# Patient Record
Sex: Female | Born: 1980 | Race: Black or African American | Hispanic: No | Marital: Married | Smoking: Never smoker
Health system: Southern US, Community
[De-identification: ages and names within clinical notes are randomized; demographics above are authoritative.]

## PROBLEM LIST (undated history)

## (undated) DIAGNOSIS — Z803 Family history of malignant neoplasm of breast: Secondary | ICD-10-CM

## (undated) DIAGNOSIS — Z800 Family history of malignant neoplasm of digestive organs: Secondary | ICD-10-CM

## (undated) DIAGNOSIS — Z8049 Family history of malignant neoplasm of other genital organs: Secondary | ICD-10-CM

## (undated) HISTORY — DX: Family history of malignant neoplasm of breast: Z80.3

## (undated) HISTORY — DX: Family history of malignant neoplasm of other genital organs: Z80.49

## (undated) HISTORY — DX: Family history of malignant neoplasm of digestive organs: Z80.0

---

## 2001-10-25 ENCOUNTER — Other Ambulatory Visit: Admission: RE | Admit: 2001-10-25 | Discharge: 2001-10-25 | Payer: Self-pay | Admitting: Internal Medicine

## 2014-03-13 DIAGNOSIS — O009 Unspecified ectopic pregnancy without intrauterine pregnancy: Secondary | ICD-10-CM | POA: Diagnosis present

## 2014-03-13 HISTORY — DX: Unspecified ectopic pregnancy without intrauterine pregnancy: O00.90

## 2014-07-10 DIAGNOSIS — N911 Secondary amenorrhea: Secondary | ICD-10-CM | POA: Insufficient documentation

## 2019-01-28 ENCOUNTER — Inpatient Hospital Stay (HOSPITAL_COMMUNITY): Payer: Federal, State, Local not specified - PPO | Admitting: Anesthesiology

## 2019-01-28 ENCOUNTER — Encounter (HOSPITAL_COMMUNITY)
Admission: AD | Disposition: A | Discharge status: Home / Self Care | Payer: Self-pay | Source: Home / Self Care | Attending: Obstetrics and Gynecology

## 2019-01-28 ENCOUNTER — Other Ambulatory Visit: Payer: Self-pay

## 2019-01-28 ENCOUNTER — Encounter (HOSPITAL_COMMUNITY): Payer: Self-pay | Admitting: *Deleted

## 2019-01-28 ENCOUNTER — Ambulatory Visit (HOSPITAL_COMMUNITY)
Admission: AD | Admit: 2019-01-28 | Discharge: 2019-01-28 | Disposition: A | Discharge status: Home / Self Care | Payer: Federal, State, Local not specified - PPO | Attending: Obstetrics and Gynecology | Admitting: Obstetrics and Gynecology

## 2019-01-28 ENCOUNTER — Ambulatory Visit: Admit: 2019-01-28 | Payer: Federal, State, Local not specified - PPO | Admitting: Obstetrics and Gynecology

## 2019-01-28 DIAGNOSIS — Z3A09 9 weeks gestation of pregnancy: Secondary | ICD-10-CM | POA: Diagnosis not present

## 2019-01-28 DIAGNOSIS — Z20828 Contact with and (suspected) exposure to other viral communicable diseases: Secondary | ICD-10-CM | POA: Diagnosis not present

## 2019-01-28 DIAGNOSIS — O09521 Supervision of elderly multigravida, first trimester: Secondary | ICD-10-CM | POA: Diagnosis not present

## 2019-01-28 DIAGNOSIS — Z3480 Encounter for supervision of other normal pregnancy, unspecified trimester: Secondary | ICD-10-CM | POA: Diagnosis not present

## 2019-01-28 DIAGNOSIS — Z6791 Unspecified blood type, Rh negative: Secondary | ICD-10-CM | POA: Diagnosis not present

## 2019-01-28 DIAGNOSIS — O009 Unspecified ectopic pregnancy without intrauterine pregnancy: Secondary | ICD-10-CM | POA: Diagnosis present

## 2019-01-28 DIAGNOSIS — O26899 Other specified pregnancy related conditions, unspecified trimester: Secondary | ICD-10-CM | POA: Diagnosis not present

## 2019-01-28 DIAGNOSIS — O00101 Right tubal pregnancy without intrauterine pregnancy: Secondary | ICD-10-CM | POA: Insufficient documentation

## 2019-01-28 DIAGNOSIS — Z3201 Encounter for pregnancy test, result positive: Secondary | ICD-10-CM | POA: Diagnosis not present

## 2019-01-28 DIAGNOSIS — O09291 Supervision of pregnancy with other poor reproductive or obstetric history, first trimester: Secondary | ICD-10-CM | POA: Diagnosis not present

## 2019-01-28 HISTORY — DX: Unspecified ectopic pregnancy without intrauterine pregnancy: O00.90

## 2019-01-28 HISTORY — PX: DIAGNOSTIC LAPAROSCOPY WITH REMOVAL OF ECTOPIC PREGNANCY: SHX6449

## 2019-01-28 LAB — COMPREHENSIVE METABOLIC PANEL
ALT: 22 U/L (ref 0–44)
AST: 16 U/L (ref 15–41)
Albumin: 4.1 g/dL (ref 3.5–5.0)
Alkaline Phosphatase: 59 U/L (ref 38–126)
Anion gap: 10 (ref 5–15)
BUN: 6 mg/dL (ref 6–20)
CO2: 19 mmol/L — ABNORMAL LOW (ref 22–32)
Calcium: 9.1 mg/dL (ref 8.9–10.3)
Chloride: 107 mmol/L (ref 98–111)
Creatinine, Ser: 0.64 mg/dL (ref 0.44–1.00)
GFR calc Af Amer: >60 mL/min (ref >60)
GFR calc non Af Amer: >60 mL/min (ref >60)
Glucose, Bld: 90 mg/dL (ref 70–99)
Potassium: 3.6 mmol/L (ref 3.5–5.1)
Sodium: 136 mmol/L (ref 135–145)
Total Bilirubin: 0.6 mg/dL (ref 0.3–1.2)
Total Protein: 7.2 g/dL (ref 6.5–8.1)

## 2019-01-28 LAB — CBC
HCT: 37.8 % (ref 36.0–46.0)
Hemoglobin: 12.5 g/dL (ref 12.0–15.0)
MCH: 29.3 pg (ref 26.0–34.0)
MCHC: 33.1 g/dL (ref 30.0–36.0)
MCV: 88.7 fL (ref 80.0–100.0)
Platelets: 318 10*3/uL (ref 150–400)
RBC: 4.26 MIL/uL (ref 3.87–5.11)
RDW: 12.6 % (ref 11.5–15.5)
WBC: 8.2 10*3/uL (ref 4.0–10.5)
nRBC: 0 % (ref 0.0–0.2)

## 2019-01-28 LAB — TYPE AND SCREEN
ABO/RH(D): O NEG
Antibody Screen: NEGATIVE
Weak D: NEGATIVE

## 2019-01-28 LAB — ABO/RH: ABO/RH(D): O NEG

## 2019-01-28 LAB — HIV ANTIBODY (ROUTINE TESTING W REFLEX): HIV 1&2 Ab, 4th Generation: NONREACTIVE

## 2019-01-28 LAB — SARS CORONAVIRUS 2 BY RT PCR (HOSPITAL ORDER, PERFORMED IN ~~LOC~~ HOSPITAL LAB): SARS Coronavirus 2: NEGATIVE

## 2019-01-28 LAB — CBC AND DIFFERENTIAL
HCT: 37 (ref 36–46)
Hemoglobin: 12.2 (ref 12.0–16.0)
Platelets: 325 (ref 150–399)
WBC: 7.9

## 2019-01-28 LAB — HM HIV SCREENING LAB: HM HIV Screening: NEGATIVE

## 2019-01-28 LAB — HCG, QUANTITATIVE, PREGNANCY: hCG, Beta Chain, Quant, S: 25413 m[IU]/mL — ABNORMAL HIGH (ref <5)

## 2019-01-28 SURGERY — LAPAROSCOPY, WITH ECTOPIC PREGNANCY SURGICAL TREATMENT
Anesthesia: General

## 2019-01-28 SURGERY — LAPAROSCOPY, WITH ECTOPIC PREGNANCY SURGICAL TREATMENT
Anesthesia: General | Laterality: Right

## 2019-01-28 MED ORDER — DEXAMETHASONE SODIUM PHOSPHATE 4 MG/ML IJ SOLN
INTRAMUSCULAR | Status: DC | PRN
  Administered 2019-01-28 (×2): 5 mg via INTRAVENOUS

## 2019-01-28 MED ORDER — ACETAMINOPHEN 10 MG/ML IV SOLN
INTRAVENOUS | Status: AC
  Filled 2019-01-28: qty 100

## 2019-01-28 MED ORDER — CEFAZOLIN SODIUM-DEXTROSE 2-4 GM/100ML-% IV SOLN
2.0000 g | INTRAVENOUS | Status: AC
  Administered 2019-01-28: 2 g via INTRAVENOUS
  Filled 2019-01-28: qty 100

## 2019-01-28 MED ORDER — PHENYLEPHRINE 40 MCG/ML (10ML) SYRINGE FOR IV PUSH (FOR BLOOD PRESSURE SUPPORT)
PREFILLED_SYRINGE | INTRAVENOUS | Status: AC
  Filled 2019-01-28: qty 40

## 2019-01-28 MED ORDER — PROPOFOL 10 MG/ML IV BOLUS
INTRAVENOUS | Status: DC | PRN
  Administered 2019-01-28: 170 mg via INTRAVENOUS

## 2019-01-28 MED ORDER — ACETAMINOPHEN 10 MG/ML IV SOLN
1000.0000 mg | Freq: Four times a day (QID) | INTRAVENOUS | Status: DC
  Administered 2019-01-28: 1000 mg via INTRAVENOUS

## 2019-01-28 MED ORDER — RHO D IMMUNE GLOBULIN 1500 UNIT/2ML IJ SOSY
300.0000 ug | PREFILLED_SYRINGE | Freq: Once | INTRAMUSCULAR | Status: AC
  Administered 2019-01-28: 16:00:00 300 ug via INTRAMUSCULAR
  Filled 2019-01-28: qty 2

## 2019-01-28 MED ORDER — ALBUMIN HUMAN 5 % IV SOLN
INTRAVENOUS | Status: DC | PRN
  Administered 2019-01-28: 18:00:00 via INTRAVENOUS

## 2019-01-28 MED ORDER — SUCCINYLCHOLINE CHLORIDE 20 MG/ML IJ SOLN
INTRAMUSCULAR | Status: DC | PRN
  Administered 2019-01-28: 120 mg via INTRAVENOUS

## 2019-01-28 MED ORDER — MIDAZOLAM HCL 5 MG/5ML IJ SOLN
INTRAMUSCULAR | Status: DC | PRN
  Administered 2019-01-28: 2 mg via INTRAVENOUS

## 2019-01-28 MED ORDER — IBUPROFEN 800 MG PO TABS: 800.0000 mg | ORAL_TABLET | Freq: Three times a day (TID) | ORAL | 0 refills | Status: DC | PRN

## 2019-01-28 MED ORDER — BUPIVACAINE HCL (PF) 0.25 % IJ SOLN
INTRAMUSCULAR | Status: AC
  Filled 2019-01-28: qty 30

## 2019-01-28 MED ORDER — ACETAMINOPHEN 500 MG PO TABS: 500.0000 mg | ORAL_TABLET | Freq: Three times a day (TID) | ORAL | 0 refills | Status: DC | PRN

## 2019-01-28 MED ORDER — SUCCINYLCHOLINE CHLORIDE 200 MG/10ML IV SOSY
PREFILLED_SYRINGE | INTRAVENOUS | Status: AC
  Filled 2019-01-28: qty 10

## 2019-01-28 MED ORDER — MIDAZOLAM HCL 2 MG/2ML IJ SOLN
INTRAMUSCULAR | Status: AC
  Filled 2019-01-28: qty 2

## 2019-01-28 MED ORDER — BUPIVACAINE HCL (PF) 0.25 % IJ SOLN
INTRAMUSCULAR | Status: DC | PRN
  Administered 2019-01-28: 30 mL

## 2019-01-28 MED ORDER — DEXAMETHASONE SODIUM PHOSPHATE 10 MG/ML IJ SOLN
INTRAMUSCULAR | Status: AC
  Filled 2019-01-28: qty 1

## 2019-01-28 MED ORDER — PHENYLEPHRINE HCL (PRESSORS) 10 MG/ML IV SOLN
INTRAVENOUS | Status: DC | PRN
  Administered 2019-01-28: 120 ug via INTRAVENOUS
  Administered 2019-01-28: 80 ug via INTRAVENOUS
  Administered 2019-01-28 (×2): 160 ug via INTRAVENOUS
  Administered 2019-01-28: 80 ug via INTRAVENOUS

## 2019-01-28 MED ORDER — SUGAMMADEX SODIUM 200 MG/2ML IV SOLN
INTRAVENOUS | Status: DC | PRN
  Administered 2019-01-28: 400 mg via INTRAVENOUS

## 2019-01-28 MED ORDER — LIDOCAINE HCL (CARDIAC) PF 100 MG/5ML IV SOSY
PREFILLED_SYRINGE | INTRAVENOUS | Status: DC | PRN
  Administered 2019-01-28: 80 mg via INTRAVENOUS

## 2019-01-28 MED ORDER — OXYCODONE HCL 5 MG PO TABS: 5.0000 mg | ORAL_TABLET | Freq: Four times a day (QID) | ORAL | 0 refills | Status: DC | PRN

## 2019-01-28 MED ORDER — FENTANYL CITRATE (PF) 100 MCG/2ML IJ SOLN
INTRAMUSCULAR | Status: DC | PRN
  Administered 2019-01-28: 100 ug via INTRAVENOUS

## 2019-01-28 MED ORDER — FENTANYL CITRATE (PF) 250 MCG/5ML IJ SOLN
INTRAMUSCULAR | Status: AC
  Filled 2019-01-28: qty 5

## 2019-01-28 MED ORDER — LACTATED RINGERS IV SOLN
INTRAVENOUS | Status: DC
  Administered 2019-01-28: 18:00:00 via INTRAVENOUS

## 2019-01-28 MED ORDER — PHENYLEPHRINE 40 MCG/ML (10ML) SYRINGE FOR IV PUSH (FOR BLOOD PRESSURE SUPPORT)
PREFILLED_SYRINGE | INTRAVENOUS | Status: AC
  Filled 2019-01-28: qty 10

## 2019-01-28 MED ORDER — ONDANSETRON HCL 4 MG/2ML IJ SOLN
INTRAMUSCULAR | Status: AC
  Filled 2019-01-28: qty 2

## 2019-01-28 MED ORDER — ROCURONIUM BROMIDE 10 MG/ML (PF) SYRINGE
PREFILLED_SYRINGE | INTRAVENOUS | Status: AC
  Filled 2019-01-28: qty 10

## 2019-01-28 MED ORDER — ROCURONIUM BROMIDE 100 MG/10ML IV SOLN
INTRAVENOUS | Status: DC | PRN
  Administered 2019-01-28: 40 mg via INTRAVENOUS

## 2019-01-28 MED ORDER — HYDROMORPHONE HCL 1 MG/ML IJ SOLN: 0.2500 mg | INTRAMUSCULAR | Status: DC | PRN

## 2019-01-28 MED ORDER — ONDANSETRON HCL 4 MG/2ML IJ SOLN
INTRAMUSCULAR | Status: DC | PRN
  Administered 2019-01-28: 4 mg via INTRAVENOUS

## 2019-01-28 MED ORDER — 0.9 % SODIUM CHLORIDE (POUR BTL) OPTIME
TOPICAL | Status: DC | PRN
  Administered 2019-01-28: 1000 mL

## 2019-01-28 SURGICAL SUPPLY — 35 items
ADH SKN CLS LQ APL DERMABOND (GAUZE/BANDAGES/DRESSINGS) ×1
BAG SPEC RTRVL LRG 6X4 10 (ENDOMECHANICALS)
CABLE HIGH FREQUENCY MONO STRZ (ELECTRODE) IMPLANT
DERMABOND ADHESIVE PROPEN (GAUZE/BANDAGES/DRESSINGS) ×2
DERMABOND ADVANCED .7 DNX6 (GAUZE/BANDAGES/DRESSINGS) IMPLANT
DRESSING OPSITE X SMALL 2X3 (GAUZE/BANDAGES/DRESSINGS) ×2 IMPLANT
DRSG OPSITE POSTOP 3X4 (GAUZE/BANDAGES/DRESSINGS) IMPLANT
GLOVE BIOGEL M 6.5 STRL (GLOVE) ×6 IMPLANT
GLOVE BIOGEL PI IND STRL 6.5 (GLOVE) ×1 IMPLANT
GLOVE BIOGEL PI IND STRL 7.0 (GLOVE) ×2 IMPLANT
GLOVE BIOGEL PI INDICATOR 6.5 (GLOVE) ×6
GLOVE BIOGEL PI INDICATOR 7.0 (GLOVE) ×4
GOWN STRL REUS W/ TWL LRG LVL3 (GOWN DISPOSABLE) ×2 IMPLANT
GOWN STRL REUS W/TWL LRG LVL3 (GOWN DISPOSABLE) ×6
KIT TURNOVER KIT B (KITS) ×3 IMPLANT
NS IRRIG 1000ML POUR BTL (IV SOLUTION) ×3 IMPLANT
PACK LAPAROSCOPY BASIN (CUSTOM PROCEDURE TRAY) ×3 IMPLANT
PACK TRENDGUARD 450 HYBRID PRO (MISCELLANEOUS) IMPLANT
POUCH SPECIMEN RETRIEVAL 10MM (ENDOMECHANICALS) IMPLANT
PROTECTOR NERVE ULNAR (MISCELLANEOUS) ×6 IMPLANT
SEALER TISSUE G2 CVD JAW 35 (ENDOMECHANICALS) IMPLANT
SEALER TISSUE G2 CVD JAW 45CM (ENDOMECHANICALS)
SET IRRIG TUBING LAPAROSCOPIC (IRRIGATION / IRRIGATOR) ×2 IMPLANT
SET TUBE SMOKE EVAC HIGH FLOW (TUBING) ×3 IMPLANT
SHEARS HARMONIC ACE PLUS 36CM (ENDOMECHANICALS) ×2 IMPLANT
SLEEVE ENDOPATH XCEL 5M (ENDOMECHANICALS) ×3 IMPLANT
SOLUTION ELECTROLUBE (MISCELLANEOUS) ×2 IMPLANT
SUT VICRYL 0 UR6 27IN ABS (SUTURE) IMPLANT
SUT VICRYL 4-0 PS2 18IN ABS (SUTURE) ×3 IMPLANT
TOWEL GREEN STERILE FF (TOWEL DISPOSABLE) ×6 IMPLANT
TRAY FOLEY W/BAG SLVR 14FR (SET/KITS/TRAYS/PACK) ×3 IMPLANT
TRENDGUARD 450 HYBRID PRO PACK (MISCELLANEOUS) ×3
TROCAR XCEL NON-BLD 11X100MML (ENDOMECHANICALS) ×2 IMPLANT
TROCAR XCEL NON-BLD 5MMX100MML (ENDOMECHANICALS) ×3 IMPLANT
WARMER LAPAROSCOPE (MISCELLANEOUS) ×3 IMPLANT

## 2019-01-28 NOTE — MAU Note (Signed)
Covid swab collected. PT tolerated well. PT asymptomatic 

## 2019-01-28 NOTE — Op Note (Signed)
01/28/2019  7:10 PM  PATIENT:  Carly White  38 y.o. female  PRE-OPERATIVE DIAGNOSIS:  Ectopic pregnancy  POST-OPERATIVE DIAGNOSIS:  Ectopic pregnancy  PROCEDURE:  Procedure(s): DIAGNOSTIC LAPAROSCOPY  for removal of ectopic pregnancy (Right)  SURGEON:  Surgeon(s) and Role:    Christophe Louis, MD - Primary    * Dian Queen, MD - Assisting  PHYSICIAN ASSISTANT:NOne   ASSISTANTS: Dr. Thurnell Lose assisted due to complexity of the surgery    ANESTHESIA:   general  EBL:  15 mL   BLOOD ADMINISTERED:none  DRAINS: none   LOCAL MEDICATIONS USED:  MARCAINE     SPECIMEN:  Source of Specimen:  Portion of the right fallopian tube and right ectopic pregnancy   DISPOSITION OF SPECIMEN:  PATHOLOGY  COUNTS:  YES  TOURNIQUET:  * No tourniquets in log *  DICTATION: .Dragon Dictation  PLAN OF CARE: Discharge to home after PACU  PATIENT DISPOSITION:  PACU - hemodynamically stable.   Delay start of Pharmacological VTE agent (>24hrs) due to surgical blood loss or risk of bleeding: not applicable   Findings: Dilated right fallopian tube with ecotpic pregnancy at the distal portion. Normal right ovary. Normal left ovary and fallopian tube.   Procedure: Procedure: the patient was taken to the operating room placed under general anesthesia. Time Out was performed.  She was  Prepped and draped in the normal sterile fashion. A foley catheter was placed. A uterine manipulator was placed. Attention was turned to the abdomen where the umbilicus was injected with 10 cc of marcaine. A 10 mm trocar was placed under direct visualization. Pneumoperitoneum was achieved with C02 gas... A 5 mm trocar was placed in the right and left lower quadrants. Each trocar site was injected with 10 cc of marcaine prior to trocar placement. The harmonic scalpel was used to excise  The portion of the  right  fallopian tube was excised along the mesosalpinx to the cornu with the harmonic scalpel.  An endo catch  bag was placed through the 10 mm umbilical port. The specimen was placed in the bag and removed through the umbilical incision. Pneumoperitoneum was reestablished.  The pelvis was irrigated. . Excellent hemostasis was noted. All trocars were removed under direct visualization . The pneumoperitoneum was released.  The fascia of the umbilical incision was re approximated with 0 vicryl. The skin incisions were closed with 4-0 vicryl and derma bond.  the patient was taken to the recovery room awake and in stable condition.  Sponge lap and needle counts were correct times 2.

## 2019-01-28 NOTE — Anesthesia Postprocedure Evaluation (Signed)
Anesthesia Post Note  Patient: Carly White  Procedure(s) Performed: DIAGNOSTIC LAPAROSCOPY  for removal of ectopic pregnancy (Right )     Patient location during evaluation: PACU Anesthesia Type: General Level of consciousness: awake and alert Pain management: pain level controlled Vital Signs Assessment: post-procedure vital signs reviewed and stable Respiratory status: spontaneous breathing, nonlabored ventilation, respiratory function stable and patient connected to nasal cannula oxygen Cardiovascular status: blood pressure returned to baseline and stable Postop Assessment: no apparent nausea or vomiting Anesthetic complications: no    Last Vitals:  Vitals:   01/28/19 1910 01/28/19 1925  BP: 118/70 111/71  Pulse: (!) 107 98  Resp: 18 15  Temp:  36.9 C  SpO2: 100% 100%    Last Pain:  Vitals:   01/28/19 1855  PainSc: 0-No pain                 Margarita Bobrowski COKER

## 2019-01-28 NOTE — Anesthesia Procedure Notes (Signed)
Procedure Name: Intubation Date/Time: 01/28/2019 5:34 PM Performed by: Oletta Lamas, CRNA Pre-anesthesia Checklist: Patient identified, Emergency Drugs available, Suction available and Patient being monitored Patient Re-evaluated:Patient Re-evaluated prior to induction Oxygen Delivery Method: Circle System Utilized Preoxygenation: Pre-oxygenation with 100% oxygen Induction Type: IV induction and Rapid sequence Laryngoscope Size: Miller and 2 Grade View: Grade I Tube type: Oral Number of attempts: 1 Airway Equipment and Method: Stylet and Oral airway Placement Confirmation: ETT inserted through vocal cords under direct vision,  positive ETCO2 and breath sounds checked- equal and bilateral Secured at: 22 cm Tube secured with: Tape Dental Injury: Teeth and Oropharynx as per pre-operative assessment

## 2019-01-28 NOTE — H&P (Signed)
Subjective: Chief Complaint(s):   etopic pregnancy   HPI:  Isolation Precautions 1. Is fever present / reported?: No, 2. Are respiratory illness symptom(s) present / reported?: No, 3. Are other symptom(s) present / reported?: No, 5. Has there been reported travel to a High Risk respiratory illness region?: No, 6. Has close* contact with person(s) known to have communicable illness been reported?: No, 7. Did travel or close contact (if applicable) occur within 14 days of symptom onset?: No" label="Respiratory Illness Screening" propId="[id number]" catId="[id number]" encId="[id number]"Respiratory Illness Screening 1. Is fever present / reported? No, 2. Are respiratory illness symptom(s) present / reported? No, 3. Are other symptom(s) present / reported? No, 5. Has there been reported travel to a High Risk respiratory illness region? No, 6. Has close* contact with person(s) known to have communicable illness been reported? No, 7. Did travel or close contact (if applicable) occur within 14 days of symptom onset? No.  General 38 y/o presents for ectopic pregnancy She is a G5p1,0,3,1 at 9wks 2d based off of her LMP on 11/24/2018. She had u/s today due to h/o miscarriage. On u/s no gestational sac seen in utero. She has multiple small fibroids, the largest was 1.3cm. In the right adnexa there is a hypoechoic mass measuring 2.8cm, blood flow noted in the mass. This is consistent with ectopic pregnancy.  Of note she has a h/o of ectopic pregnancy. She is unsure what side the ectopic pregnancy was on. She was 38 years old at that time. Pt. states that she has been experiencing some lower back pain on the right side.  Pt. denies experiencing any vaginal bleeding.  Upon pelvic examination no pain noted upon palpation. Current Medication: Taking Prenatal Vitamin 27-0.8 MG Tablet 1 tablet Orally Once a day. Medication List reviewed and reconciled with the patient. Medical History:  Breast Feeding: yes        Allergies/Intolerance:  N.K.D.A.   Gyn History:  Sexual activity currently sexually active. Periods : every month. LMP 11/24/2018. Denies Birth control. Last pap smear date 05/2017-per pt. Denies Last mammogram date. Denies Abnormal pap smear. Denies STD. Menarche 88.   OB History:  Number of pregnancies 5. miscarriages 2. Pregnancy # 1 Ectopic pregnancy. Pregnancy # 2 miscarriage. Pregnancy # 3 miscarriage. Pregnancy # 4: live birth, vaginal delivery, girl. Pregnancy # 5: Current.   Surgical History:  ectopic pregnancy   Hospitalization:  Childbirth 03/2017   Family History:  Father: alive, diagnosed with Hypertension    Mother: alive, Breast cancer    Maternal Grand Mother: Ovarian cancer    1 sister(s) . 1daughter(s) .    Mother with breast cancer (2002) MGM with Ovarian Cancer.  Social History: General no Alcohol, Social-stopped once pregnant.  Children: 1 girl.  Tobacco use cigarettes: Never smoked, Tobacco history last updated 01/28/2019.  Marital Status: married.  no Recreational drug use.  OCCUPATION: employed, Engineer, structural.   ROS: CONSTITUTIONAL No" label="Chills" value="" options="no,yes" propid="91" itemid="193425" categoryid="10464" encounterid="[id number]"Chills No. No" label="Fatigue" value="" options="no,yes" propid="91" itemid="[id number]" categoryid="10464" encounterid="[id number]"Fatigue No. No" label="Fever" value="" options="no,yes" propid="91" itemid="10467" categoryid="10464" encounterid="[id number]"Fever No. No" label="Night sweats" value="" options="no,yes" propid="91" itemid="193426" categoryid="10464" encounterid="[id number]"Night sweats No. No" label="Recent travel outside [place]" value="" options="no,yes" propid="91" itemid="444261" categoryid="10464" encounterid="[id number]"Recent travel outside [place] No. No" label="Sweats" value="" options="no,yes" propid="91" itemid="193427" categoryid="10464" encounterid="[id number]"Sweats No. No" label="Weight change" value="" options="no,yes"  propid="91" itemid="194825" categoryid="10464" encounterid="[id number]"Weight change No.  OPHTHALMOLOGY no" label="Blurring of vision" value="" options="no,yes" propid="91" itemid="12520" categoryid="12516" encounterid="[id number]"Blurring of vision no. no" label="Change in vision" value="" options="no,yes" propid="91" itemid="193469" categoryid="12516" encounterid="[id number]"Change in vision no. no" label="Double  vision" value="" options="no,yes" propid="91" itemid="194379" categoryid="12516" encounterid="[id number]"Double vision no.  ENT no" label="Dizziness" value="" options="no,yes" propid="91" itemid="193612" categoryid="10481" encounterid="[id number]"Dizziness no. Nose bleeds no. Sore throat no. Teeth pain no.  ALLERGY no" label="Hives" value="" options="no,yes" propid="91" itemid="202589" categoryid="138152" encounterid="[id number]"Hives no.  CARDIOLOGY no" label="Chest pain" value="" options="no,yes" propid="91" itemid="193603" categoryid="10488" encounterid="[id number]"Chest pain no. no" label="High blood pressure" value="" options="no,yes" propid="91" itemid="199089" categoryid="10488" encounterid="[id number]"High blood pressure no. no" label="Irregular heart beat" value="" options="no,yes" propid="91" itemid="202598" categoryid="10488" encounterid="[id number]"Irregular heart beat no. no" label="Leg edema" value="" options="no,yes" propid="91" itemid="10491" categoryid="10488" encounterid="[id number]"Leg edema no. no" label="Palpitations" value="" options="no,yes" propid="91" itemid="10490" categoryid="10488" encounterid="[id number]"Palpitations no.  RESPIRATORY no" label="Shortness of breath" value="" options="no" propid="91" itemid="270013" categoryid="138132" encounterid="[id number]"Shortness of breath no. no" label="Cough" value="" options="no,yes" propid="91" itemid="172745" categoryid="138132" encounterid="[id number]"Cough no. no" label="Wheezing" value="" options="no,yes" propid="91" itemid="193621"  categoryid="138132" encounterid="[id number]"Wheezing no.  UROLOGY no" label="Pain with urination" value="" options="no,yes" propid="91" itemid="194377" categoryid="138166" encounterid="[id number]"Pain with urination no. no" label="Urinary urgency" value="" options="no,yes" propid="91" itemid="193493" categoryid="138166" encounterid="[id number]"Urinary urgency no. no" label="Urinary frequency" value="" options="no,yes" propid="91" itemid="193492" categoryid="138166" encounterid="[id number]"Urinary frequency no. no" label="Urinary incontinence" value="" options="no,yes" propid="91" itemid="138171" categoryid="138166" encounterid="[id number]"Urinary incontinence no. No" label="Difficulty urinating" value="" options="no,yes" propid="91" itemid="138167" categoryid="138166" encounterid="[id number]"Difficulty urinating No. No" label="Blood in urine" value="" options="no,yes" propid="91" itemid="138168" categoryid="138166" encounterid="[id number]"Blood in urine No.  GASTROENTEROLOGY no" label="Abdominal pain" value="" options="no,yes" propid="91" itemid="10496" categoryid="10494" encounterid="[id number]"Abdominal pain no. no" label="Appetite change" value="" options="no,yes" propid="91" itemid="193447" categoryid="10494" encounterid="[id number]"Appetite change no. no" label="Bloating/belching" value="" options="no,yes" propid="91" itemid="193448" categoryid="10494" encounterid="[id number]"Bloating/belching no. no" label="Blood in stool or on toilet paper" value="" options="no,yes" propid="91" itemid="10503" categoryid="10494" encounterid="[id number]"Blood in stool or on toilet paper no. no" label="Change in bowel movements" value="" options="no,yes" propid="91" itemid="199106" categoryid="10494" encounterid="[id number]"Change in bowel movements no. no" label="Constipation" value="" options="no,yes" propid="91" itemid="10501" categoryid="10494" encounterid="[id number]"Constipation no. no" label="Diarrhea" value="" options="no,yes" propid="91"  itemid="10502" categoryid="10494" encounterid="[id number]"Diarrhea no. no" label="Difficulty swallowing" value="" options="no,yes" propid="91" itemid="199104" categoryid="10494" encounterid="[id number]"Difficulty swallowing no. no" label="Nausea" value="" options="no,yes" propid="91" itemid="10499" categoryid="10494" encounterid="[id number]"Nausea no.  FEMALE REPRODUCTIVE no" label="Vulvar pain" value="" options="no,yes" propid="91" itemid="453725" categoryid="10525" encounterid="[id number]"Vulvar pain no. no" label="Vulvar rash" value="" options="no,yes" propid="91" itemid="453726" categoryid="10525" encounterid="[id number]"Vulvar rash no. no" label="Abnormal vaginal bleeding" value="" options="no, yes" propid="91" itemid="444315" categoryid="10525" encounterid="[id number]"Abnormal vaginal bleeding no. no" label="Breast pain" value="" options="no,yes" propid="91" itemid="186083" categoryid="10525" encounterid="[id number]"Breast pain no. no" label="Nipple discharge" value="" options="no,yes" propid="91" itemid="186084" categoryid="10525" encounterid="[id number]"Nipple discharge no. no" label="Pain with intercourse" value="" options="no,yes" propid="91" itemid="275823" categoryid="10525" encounterid="[id number]"Pain with intercourse no. no" label="Pelvic pain" value="" options="no,yes" propid="91" itemid="186082" categoryid="10525" encounterid="[id number]"Pelvic pain no. no" label="Unusual vaginal discharge" value="" options="no,yes" propid="91" itemid="278230" categoryid="10525" encounterid="[id number]"Unusual vaginal discharge no. no" label="Vaginal itching" value="" options="no,yes" propid="91" itemid="278942" categoryid="10525" encounterid="[id number]"Vaginal itching no.  MUSCULOSKELETAL no" label="Muscle aches" value="" options="no,yes" propid="91" itemid="193461" categoryid="10514" encounterid="[id number]"Muscle aches no.  NEUROLOGY no" label="Headache" value="" options="no,yes" propid="91" itemid="12513" categoryid="12512"  encounterid="[id number]"Headache no. no" label="Tingling/numbness" value="" options="no,yes" propid="91" itemid="12514" categoryid="12512" encounterid="[id number]"Tingling/numbness no. no" label="Weakness" value="" options="no,yes" propid="91" itemid="193468" categoryid="12512" encounterid="[id number]"Weakness no.  PSYCHOLOGY no" label="Depression" value="" options="" propid="91" itemid="275919" categoryid="10520" encounterid="[id number]"Depression no. no" label="Anxiety" value="" options="no,yes" propid="91" itemid="172748" categoryid="10520" encounterid="[id number]"Anxiety no. no" label="Nervousness" value="" options="no,yes" propid="91" itemid="199158" categoryid="10520" encounterid="[id number]"Nervousness no. no" label="Sleep disturbances" value="" options="no,yes" propid="91" itemid="12502" categoryid="10520" encounterid="[id number]"Sleep disturbances no. no " label="Suicidal ideation" value="" options="no,yes" propid="91" itemid="72718" categoryid="10520" encounterid="[id number]"Suicidal ideation no .  ENDOCRINOLOGY no" label="Excessive thirst" value="" options="no,yes" propid="91" itemid="194628" categoryid="12508" encounterid="[id number]"Excessive thirst no. no" label="Excessive urination" value="" options="no,yes" propid="91" itemid="196285" categoryid="12508" encounterid="[id number]"Excessive urination no. no" label="Hair loss" value="" options="no, yes" propid="91" itemid="444314" categoryid="12508" encounterid="[id number]"Hair loss no. no" label="Heat or cold intolerance" value="" options="" propid="91" itemid="447284" categoryid="12508" encounterid="[id number]"Heat or cold intolerance no.  HEMATOLOGY/LYMPH no" label="Abnormal bleeding" value="" options="no,yes" propid="91" itemid="199152" categoryid="138157" encounterid="[id number]"Abnormal bleeding no. no" label="Easy bruising" value="" options="no,yes" propid="91" itemid="170653" categoryid="138157" encounterid="[id number]"Easy bruising no. no" label="Swollen glands"  value="" options="no,yes" propid="91"  itemid="138158" categoryid="138157" encounterid="[id number]"Swollen glands no.  DERMATOLOGY no" label="New/changing skin lesion" value="" options="no,yes" propid="91" itemid="199126" categoryid="12503" encounterid="[id number]"New/changing skin lesion no. no" label="Rash" value="" options="no,yes" propid="91" itemid="12504" categoryid="12503" encounterid="[id number]"Rash no. no" label="Sores" value="" options="" propid="91" itemid="444313" categoryid="12503" encounterid="[id number]"Sores no.   Negative except as stated in HPI.  Objective: Vitals: Wt 178, Ht 63, BMI 31.53, Temp 97.8, Pulse sitting 80, BP sitting 120/70  Past Results: Examination:  General Examination alert, oriented, NAD " label="CONSTITUTIONAL:" categoryPropId="10089" examid="193638"CONSTITUTIONAL: alert, oriented, NAD .  moist, warm" label="SKIN:" categoryPropId="10109" examid="193638"SKIN: moist, warm.  Conjunctiva clear" label="EYES:" categoryPropId="21468" examid="193638"EYES: Conjunctiva clear.  clear to auscultation bilaterally" label="LUNGS:" categoryPropId="87" examid="193638"LUNGS: clear to auscultation bilaterally.  regular rate and rhythm" label="HEART:" categoryPropId="86" examid="193638"HEART: regular rate and rhythm.  soft, non-tender/non-distended, bowel sounds present " label="ABDOMEN:" categoryPropId="88" examid="193638"ABDOMEN: soft, non-tender/non-distended, bowel sounds present .  normal external genitalia, labia - unremarkable, vagina - pink moist mucosa, no lesions or abnormal discharge, cervix - no discharge or lesions or CMT, adnexa - no masses or tenderness, uterus - nontender and normal size on palpation " label="FEMALE GENITOURINARY:" categoryPropId="13414" examid="193638"FEMALE GENITOURINARY: normal external genitalia, labia - unremarkable, vagina - pink moist mucosa, no lesions or abnormal discharge, cervix - no discharge or lesions or CMT, adnexa - no masses or tenderness,  uterus - nontender and normal size on palpation .  no edema present" label="EXTREMITIES:" categoryPropId="89" examid="193638"EXTREMITIES: no edema present.  affect normal, good eye contact" label="PSYCH:" categoryPropId="16316" examid="193638"PSYCH: affect normal, good eye contact.  Physical Examination: Chaperone present for pelvic exam, Chapman,Courtney 01/28/2019 09:49:51 AM &gt; " label="Chaperone present" itemId="278390" categoryId="275238"Chaperone present for pelvic exam, Chapman,Courtney 01/28/2019 09:49:51 AM > .   Pt aware of scribe services today.   Assessment: Assessment:  Ectopic pregnancy - O00.90 (Primary)     Unspecified blood type, Rh negative - Z67.91     Other specified pregnancy related conditions, unspecified trimester - O26.899     Plan: Treatment: Ectopic pregnancy Notes: Discussed with pt. methotrexate vs. laparoscopically for treatment. Discussed risks of laparoscopically with pt. Risks of methotrexate were discussed with pt. Pt. wishes to proceed with Methotrexate injection. [facility]. .. [facility].. quantitative hcg is 25K which is too elevated for methotrexate. recommend operative laparoscopy with removal of ectopic pregnancy possible right salpingectomy. r/b/a of surgery discussed including but not limited to infection/ bleeding / damage to bowel bladder and surrounding organs with the need for further surgery. Pt voiced understanding and desires to proceed. Referral To: Reason: precert for emergency operative laporoscopy with removal of ectopic pregnancy  Unspecified blood type, Rh negative Notes: plan [place].

## 2019-01-28 NOTE — Anesthesia Preprocedure Evaluation (Addendum)
Anesthesia Evaluation  Patient identified by MRN, date of birth, ID band Patient awake    Reviewed: Allergy & Precautions, Patient's Chart, lab work & pertinent test results  Airway Mallampati: II  TM Distance: >3 FB     Dental   Pulmonary neg pulmonary ROS,    breath sounds clear to auscultation       Cardiovascular negative cardio ROS   Rhythm:Regular Rate:Normal     Neuro/Psych negative neurological ROS     GI/Hepatic negative GI ROS, Neg liver ROS,   Endo/Other  negative endocrine ROS  Renal/GU negative Renal ROS     Musculoskeletal negative musculoskeletal ROS (+)   Abdominal   Peds  Hematology negative hematology ROS (+)   Anesthesia Other Findings Ectopic pregnancy  Reproductive/Obstetrics                            Anesthesia Physical Anesthesia Plan  ASA: I and emergent  Anesthesia Plan: General   Post-op Pain Management:    Induction:   PONV Risk Score and Plan: 3 and Ondansetron, Dexamethasone and Midazolam  Airway Management Planned: Oral ETT  Additional Equipment:   Intra-op Plan:   Post-operative Plan:   Informed Consent: I have reviewed the patients History and Physical, chart, labs and discussed the procedure including the risks, benefits and alternatives for the proposed anesthesia with the patient or authorized representative who has indicated his/her understanding and acceptance.     Dental advisory given  Plan Discussed with: CRNA and Anesthesiologist  Anesthesia Plan Comments:        Anesthesia Quick Evaluation

## 2019-01-28 NOTE — Transfer of Care (Signed)
Immediate Anesthesia Transfer of Care Note  Patient: Carly White  Procedure(s) Performed: DIAGNOSTIC LAPAROSCOPY  for removal of ectopic pregnancy (Right )  Patient Location: PACU  Anesthesia Type:General  Level of Consciousness: awake, alert , oriented and patient cooperative  Airway & Oxygen Therapy: Patient Spontanous Breathing  Post-op Assessment: Report given to RN and Post -op Vital signs reviewed and stable  Post vital signs: Reviewed and stable  Last Vitals:  Vitals Value Taken Time  BP 107/91 01/28/19 1855  Temp 36.2 C 01/28/19 1855  Pulse 114 01/28/19 1855  Resp 25 01/28/19 1855  SpO2 100 % 01/28/19 1855    Last Pain:  Vitals:   01/28/19 1855  PainSc: 0-No pain         Complications: No apparent anesthesia complications

## 2019-01-28 NOTE — Discharge Instructions (Signed)
Ectopic Pregnancy  An ectopic pregnancy happens when a fertilized egg grows outside the womb (uterus). The fertilized egg cannot stay alive outside of the womb. This problem often happens in a fallopian tube. It is often caused by damage to the tube. If this problem is found early, you may be treated with medicine that stops the egg from growing. If your tube tears or bursts open (ruptures), you will bleed inside. Often, there is very bad pain in the lower belly. This is an emergency. You will need surgery. Get help right away. Follow these instructions at home: After being treated with medicine or surgery:  Rest and limit your activity for as long as told by your doctor.  Until your doctor says that it is safe: ? Do not lift anything that is heavier than 10 lb (4.5 kg) or the limit that your doctor tells you. ? Avoid exercise and any movement that takes a lot of effort.  To prevent problems when pooping (constipation): ? Eat a healthy diet. This includes:  Fruits.  Vegetables.  Whole grains. ? Drink 6-8 glasses of water a day. Contact a doctor if: Get help right away if:  You have sudden and very bad pain in your belly.  You have very bad pain in your shoulders or neck.  You have pain that gets worse and is not helped by medicine.  You have: ? A fever or chills. ? Vaginal bleeding. ? Redness or swelling at the site of a surgical cut (incision).  You feel sick to your stomach (nauseous) or you throw up (vomit).  You feel dizzy or weak.  You feel light-headed or you pass out (faint). Summary  An ectopic pregnancy happens when a fertilized egg grows outside the womb (uterus).  If this problem is found early, you may be treated with medicine that stops the egg from growing.  If your tube tears or bursts open (ruptures), you will need surgery. This is an emergency. Get help right away. This information is not intended to replace advice given to you by your health care  provider. Make sure you discuss any questions you have with your health care provider. Document Released: 08/26/2008 Document Revised: 05/12/2017 Document Reviewed: 06/23/2016 Elsevier Patient Education  2020 Elsevier Inc.  

## 2019-01-28 NOTE — MAU Note (Signed)
.   Carly White is a 38 y.o. at [redacted]w[redacted]d here in MAU for labs and methotrexate. Dr Landry Mellow called with lab results and states that she will come and talk with pt. Denies pain or VB at this time LMP: 11/24/18 Onset of complaint:  Pain score: 0 Vitals:   01/28/19 1348  BP: (!) 157/79  Pulse: 95  Resp: 16  Temp: 98 F (36.7 C)  SpO2: 100%     Lab orders placed from triage: CBC/CMP/QUANT/

## 2019-01-29 ENCOUNTER — Encounter (HOSPITAL_COMMUNITY): Payer: Self-pay | Admitting: Obstetrics and Gynecology

## 2019-01-29 LAB — RH IG WORKUP (INCLUDES ABO/RH)
ABO/RH(D): O NEG
Antibody Screen: NEGATIVE
Gestational Age(Wks): 9
Unit division: 0

## 2019-01-31 NOTE — Anesthesia Postprocedure Evaluation (Signed)
Anesthesia Post Note  Patient: Kahlie H Vanecek  Procedure(s) Performed: DIAGNOSTIC LAPAROSCOPY  for removal of ectopic pregnancy (Right )     Patient location during evaluation: PACU Anesthesia Type: General Level of consciousness: awake and alert Pain management: pain level controlled Vital Signs Assessment: post-procedure vital signs reviewed and stable Respiratory status: spontaneous breathing, nonlabored ventilation, respiratory function stable and patient connected to nasal cannula oxygen Cardiovascular status: blood pressure returned to baseline and stable Postop Assessment: no apparent nausea or vomiting Anesthetic complications: no    Last Vitals:  Vitals:   01/28/19 1910 01/28/19 1925  BP: 118/70 111/71  Pulse: (!) 107 98  Resp: 18 15  Temp:  36.9 C  SpO2: 100% 100%    Last Pain:  Vitals:   01/28/19 1855  PainSc: 0-No pain                 Jamale Spangler COKER     

## 2019-02-06 NOTE — Addendum Note (Signed)
Addendum  created 02/06/19 1701 by Belinda Block, MD   Attestation recorded in [place], [place] filed

## 2019-02-13 DIAGNOSIS — O009 Unspecified ectopic pregnancy without intrauterine pregnancy: Secondary | ICD-10-CM | POA: Diagnosis not present

## 2019-02-25 DIAGNOSIS — Z3202 Encounter for pregnancy test, result negative: Secondary | ICD-10-CM | POA: Diagnosis not present

## 2019-04-30 ENCOUNTER — Other Ambulatory Visit (HOSPITAL_COMMUNITY)
Admission: RE | Admit: 2019-04-30 | Discharge: 2019-04-30 | Disposition: A | Discharge status: Home / Self Care | Payer: Federal, State, Local not specified - PPO | Source: Ambulatory Visit | Attending: Obstetrics and Gynecology | Admitting: Obstetrics and Gynecology

## 2019-04-30 ENCOUNTER — Other Ambulatory Visit: Payer: Self-pay | Admitting: Obstetrics and Gynecology

## 2019-04-30 DIAGNOSIS — Z01419 Encounter for gynecological examination (general) (routine) without abnormal findings: Secondary | ICD-10-CM | POA: Insufficient documentation

## 2019-05-03 LAB — CYTOLOGY - PAP
Comment: NEGATIVE
Diagnosis: NEGATIVE
High risk HPV: NEGATIVE

## 2019-08-29 DIAGNOSIS — Z23 Encounter for immunization: Secondary | ICD-10-CM | POA: Diagnosis not present

## 2019-09-12 DIAGNOSIS — J301 Allergic rhinitis due to pollen: Secondary | ICD-10-CM | POA: Diagnosis not present

## 2019-09-12 DIAGNOSIS — H1013 Acute atopic conjunctivitis, bilateral: Secondary | ICD-10-CM | POA: Diagnosis not present

## 2019-09-12 DIAGNOSIS — T50995A Adverse effect of other drugs, medicaments and biological substances, initial encounter: Secondary | ICD-10-CM | POA: Diagnosis not present

## 2019-09-27 DIAGNOSIS — Z23 Encounter for immunization: Secondary | ICD-10-CM | POA: Diagnosis not present

## 2020-05-01 DIAGNOSIS — Z01419 Encounter for gynecological examination (general) (routine) without abnormal findings: Secondary | ICD-10-CM | POA: Diagnosis not present

## 2020-06-02 ENCOUNTER — Inpatient Hospital Stay: Payer: Federal, State, Local not specified - PPO | Attending: Oncology | Admitting: Licensed Clinical Social Worker

## 2020-06-02 ENCOUNTER — Other Ambulatory Visit: Payer: Self-pay

## 2020-06-02 ENCOUNTER — Inpatient Hospital Stay: Payer: Federal, State, Local not specified - PPO

## 2020-06-02 ENCOUNTER — Encounter: Payer: Self-pay | Admitting: Licensed Clinical Social Worker

## 2020-06-02 DIAGNOSIS — Z800 Family history of malignant neoplasm of digestive organs: Secondary | ICD-10-CM

## 2020-06-02 DIAGNOSIS — Z803 Family history of malignant neoplasm of breast: Secondary | ICD-10-CM | POA: Insufficient documentation

## 2020-06-02 DIAGNOSIS — Z8049 Family history of malignant neoplasm of other genital organs: Secondary | ICD-10-CM | POA: Diagnosis not present

## 2020-06-02 NOTE — Progress Notes (Addendum)
REFERRING PROVIDER: Christophe Louis, MD [place] [address] [place],  [place] [address]  PRIMARY PROVIDER:  Patient, [name]  PRIMARY REASON FOR VISIT:  1. Family history of breast cancer   2. Family history of colon cancer   3. Family history of cervical cancer      HISTORY OF PRESENT ILLNESS:   Carly White, a 39 y.o. female, was seen for a [facility] cancer genetics consultation at the request of Dr. Landry Mellow due to a family history of cancer.  Carly White presents to clinic today to discuss the possibility of a hereditary predisposition to cancer, genetic testing, and to further clarify her future cancer risks, as well as potential cancer risks for family members.    Carly White is a 39 y.o. female with no personal history of cancer.    CANCER HISTORY:  Oncology History   No history exists.     RISK FACTORS:  Menarche was at age 38.  First live birth at age 72.  OCP use for approximately 0 years.  Ovaries intact: yes.  Hysterectomy: no.  Menopausal status: premenopausal.  HRT use: 0 years. Colonoscopy: no; not examined. Mammogram within the last year: no. Number of breast biopsies: 0. Up to date with pelvic exams: yes. Any excessive radiation exposure in the past: no  Past Medical History:  Diagnosis Date  . Family history of breast cancer   . Family history of cervical cancer   . Family history of colon cancer     Past Surgical History:  Procedure Laterality Date  . DIAGNOSTIC LAPAROSCOPY WITH REMOVAL OF ECTOPIC PREGNANCY Right 01/28/2019   Procedure: DIAGNOSTIC LAPAROSCOPY  for removal of ectopic pregnancy;  Surgeon: Christophe Louis, MD;  Location: [facility];  Service: Gynecology;  Laterality: Right;    Social History   Socioeconomic History  . Marital status: Married    Spouse name: Not on file  . Number of children: Not on file  . Years of education: Not on file  . Highest education level: Not on file  Occupational History  . Not on file  Tobacco Use  . Smoking  status: Never Smoker  . Smokeless tobacco: Never Used  Substance and Sexual Activity  . Alcohol use: Never  . Drug use: Never  . Sexual activity: Yes  Other Topics Concern  . Not on file  Social History Narrative  . Not on file   Social Determinants of Health   Financial Resource Strain: Not on file  Food Insecurity: Not on file  Transportation Needs: Not on file  Physical Activity: Not on file  Stress: Not on file  Social Connections: Not on file     FAMILY HISTORY:  We obtained a detailed, 4-generation family history.  Significant diagnoses are listed below: Family History  Problem Relation Age of Onset  . Breast cancer Mother 85  . Colon cancer Maternal Uncle 35  . Cervical cancer Maternal Grandmother        dx 40s  . Colon cancer Maternal Grandfather        dx 76s  . Breast cancer Maternal Great-grandmother        dx 35s   Carly White has 1 daughter, 3. She has 1 sister, 32, no history of cancer.   Carly White mother had breast cancer at 11 and is living at 3. She does not want genetic testing. Patient has 2 maternal uncles. One had colon cancer at 31. No known cancers in maternal cousins. Maternal grandmother had  cervical cancer. Maternal grandfather had colon cancer in his 45s, his mother had breast cancer in her 47s.   Carly White father is living at 36 with no cancer history. There are no cancers that she is aware of on this side of the family.   Carly White is unaware of previous family history of genetic testing for hereditary cancer risks.  There is no reported Ashkenazi Jewish ancestry. There is no known consanguinity.    GENETIC COUNSELING ASSESSMENT: Carly White is a 39 y.o. female with a family history of breast and colon cancer which is somewhat suggestive of a hereditary cancer syndrome and predisposition to cancer. We, therefore, discussed and recommended the following at today's visit.   DISCUSSION: We discussed that approximately 5-10% of cancer is  hereditary  Most cases of hereditary breast cancer are associated with BRCA1/BRCA2 genes, although there are other genes associated with hereditary breast and colon cancer as well including CHEK2 .  We discussed that testing is beneficial for several reasons including knowing about cancer risks, identifying potential screening and risk-reduction options that may be appropriate, and to understand if other family members could be at risk for cancer and allow them to undergo genetic testing.   We reviewed the characteristics, features and inheritance patterns of hereditary cancer syndromes. We also discussed genetic testing, including the appropriate family members to test, the process of testing, insurance coverage and turn-around-time for results. We discussed the implications of a negative, positive and/or variant of uncertain significant result. We recommended Carly White pursue genetic testing for the [facility] CancerNext-Expanded+RNA gene panel.   The CancerNext-Expanded + RNAinsight gene panel offered by [employer] and includes sequencing and rearrangement analysis for the following 77 genes: IP, ALK, APC*, ATM*, AXIN2, BAP1, BARD1, BLM, BMPR1A, BRCA1*, BRCA2*, BRIP1*, CDC73, CDH1*,CDK4, CDKN1B, CDKN2A, CHEK2*, CTNNA1, DICER1, FANCC, FH, FLCN, GALNT12, KIF1B, LZTR1, MAX, MEN1, MET, MLH1*, MSH2*, MSH3, MSH6*, MUTYH*, NBN, NF1*, NF2, NTHL1, PALB2*, PHOX2B, PMS2*, POT1, PRKAR1A, PTCH1, PTEN*, RAD51C*, RAD51D*,RB1, RECQL, RET, SDHA, SDHAF2, SDHB, SDHC, SDHD, SMAD4, SMARCA4, SMARCB1, SMARCE1, STK11, SUFU, TMEM127, TP53*,TSC1, TSC2, VHL and XRCC2 (sequencing and deletion/duplication); EGFR, EGLN1, HOXB13, KIT, MITF, PDGFRA, POLD1 and POLE (sequencing only); EPCAM and GREM1 (deletion/duplication only). DNA and RNA analyses performed for * genes.  Based on Carly White's family history of cancer, she meets medical criteria for genetic testing. Despite that she meets criteria, she may still have an out of pocket cost.  We discussed that if her out of pocket cost for testing is over $100, the laboratory will call and confirm whether she wants to proceed with testing.  If the out of pocket cost of testing is less than $100 she will be billed by the genetic testing laboratory.   We discussed that some people do not want to undergo genetic testing due to fear of genetic discrimination.  A federal law called the Genetic Information Non-Discrimination Act (GINA) of 2008 helps protect individuals against genetic discrimination based on their genetic test results.  It impacts both health insurance and employment.  For health insurance, it protects against increased premiums, being kicked off insurance or being forced to take a test in order to be insured.  For employment it protects against hiring, firing and promoting decisions based on genetic test results.  Health status due to a cancer diagnosis is not protected under GINA.  This law does not protect life insurance, disability insurance, or other types of insurance.   Based on the patient's family history, a statistical model Building surveyor  [employer]) was used to estimate her risk of developing breast cancer. This estimates her lifetime risk of developing breast cancer to be approximately 24%. This estimation is in the setting of negative genetic test results and may change if she has testing that is positive.  The patient's lifetime breast cancer risk is a preliminary estimate based on available information using one of several models endorsed by the [place] (ACS). The ACS recommends consideration of breast MRI screening as an adjunct to mammography for patients at high risk (defined as 20% or greater lifetime risk).   Carly White has been determined to be at high risk for breast cancer.  Therefore, we recommend that annual screening with mammography and breast MRI be performed. Carly White should discuss her individual situation with her referring physician and determine a  breast cancer screening plan with which they are both comfortable. She could also be seen here by our high risk breast providers.     PLAN:  Despite our recommendation, Carly White did not wish to pursue genetic testing at today's visit. We understand this decision and remain available to coordinate genetic testing at any time in the future. We, therefore, recommend Carly White continue to follow the cancer screening guidelines given by her primary healthcare provider.  Carly White questions were answered to her satisfaction today. Our contact information was provided should additional questions or concerns arise. Thank you for the referral and allowing [place] to share in the care of your patient.   Faith Rogue, MS, [facility] Genetic Counselor [email or web address] Phone: [phone]  The patient was seen for a total of 30 minutes in face-to-face genetic counseling.  Dr. Grayland Ormond was available for discussion regarding this case.   _______________________________________________________________________ For Office Staff:  Number of people involved in session: 1 Was an Intern/ student involved with case: no

## 2020-06-08 ENCOUNTER — Telehealth: Payer: Self-pay | Admitting: Licensed Clinical Social Worker

## 2020-06-08 NOTE — Telephone Encounter (Signed)
error 

## 2020-07-07 DIAGNOSIS — Z6831 Body mass index (BMI) 31.0-31.9, adult: Secondary | ICD-10-CM | POA: Diagnosis not present

## 2020-07-07 DIAGNOSIS — M5430 Sciatica, unspecified side: Secondary | ICD-10-CM | POA: Diagnosis not present

## 2020-07-07 DIAGNOSIS — Z79899 Other long term (current) drug therapy: Secondary | ICD-10-CM | POA: Diagnosis not present

## 2020-07-16 DIAGNOSIS — X58XXXA Exposure to other specified factors, initial encounter: Secondary | ICD-10-CM | POA: Diagnosis not present

## 2020-07-16 DIAGNOSIS — S39012A Strain of muscle, fascia and tendon of lower back, initial encounter: Secondary | ICD-10-CM | POA: Insufficient documentation

## 2020-07-16 DIAGNOSIS — M5136 Other intervertebral disc degeneration, lumbar region: Secondary | ICD-10-CM | POA: Diagnosis not present

## 2020-07-16 DIAGNOSIS — M5459 Other low back pain: Secondary | ICD-10-CM | POA: Diagnosis not present

## 2020-07-16 DIAGNOSIS — M545 Low back pain, unspecified: Secondary | ICD-10-CM | POA: Diagnosis not present

## 2020-07-23 DIAGNOSIS — M545 Low back pain, unspecified: Secondary | ICD-10-CM | POA: Diagnosis not present

## 2020-07-23 DIAGNOSIS — M6281 Muscle weakness (generalized): Secondary | ICD-10-CM | POA: Diagnosis not present

## 2020-07-29 DIAGNOSIS — M545 Low back pain, unspecified: Secondary | ICD-10-CM | POA: Diagnosis not present

## 2020-07-29 DIAGNOSIS — M6281 Muscle weakness (generalized): Secondary | ICD-10-CM | POA: Diagnosis not present

## 2020-08-13 DIAGNOSIS — M545 Low back pain, unspecified: Secondary | ICD-10-CM | POA: Diagnosis not present

## 2020-08-13 DIAGNOSIS — M6281 Muscle weakness (generalized): Secondary | ICD-10-CM | POA: Diagnosis not present

## 2020-12-15 ENCOUNTER — Other Ambulatory Visit: Payer: Self-pay

## 2020-12-15 ENCOUNTER — Encounter (HOSPITAL_COMMUNITY): Payer: Self-pay | Admitting: Obstetrics and Gynecology

## 2020-12-15 ENCOUNTER — Inpatient Hospital Stay (HOSPITAL_COMMUNITY)
Admission: AD | Admit: 2020-12-15 | Discharge: 2020-12-15 | Disposition: A | Discharge status: Home / Self Care | Payer: Federal, State, Local not specified - PPO | Attending: Obstetrics and Gynecology | Admitting: Obstetrics and Gynecology

## 2020-12-15 ENCOUNTER — Inpatient Hospital Stay (HOSPITAL_COMMUNITY): Payer: Federal, State, Local not specified - PPO

## 2020-12-15 DIAGNOSIS — O4691 Antepartum hemorrhage, unspecified, first trimester: Secondary | ICD-10-CM | POA: Insufficient documentation

## 2020-12-15 DIAGNOSIS — Z3A01 Less than 8 weeks gestation of pregnancy: Secondary | ICD-10-CM | POA: Insufficient documentation

## 2020-12-15 DIAGNOSIS — Z3A00 Weeks of gestation of pregnancy not specified: Secondary | ICD-10-CM

## 2020-12-15 DIAGNOSIS — O360190 Maternal care for anti-D [Rh] antibodies, unspecified trimester, not applicable or unspecified: Secondary | ICD-10-CM

## 2020-12-15 DIAGNOSIS — O4690 Antepartum hemorrhage, unspecified, unspecified trimester: Secondary | ICD-10-CM | POA: Diagnosis not present

## 2020-12-15 DIAGNOSIS — O3481 Maternal care for other abnormalities of pelvic organs, first trimester: Secondary | ICD-10-CM | POA: Diagnosis not present

## 2020-12-15 DIAGNOSIS — Z3201 Encounter for pregnancy test, result positive: Secondary | ICD-10-CM | POA: Diagnosis not present

## 2020-12-15 DIAGNOSIS — O3680X0 Pregnancy with inconclusive fetal viability, not applicable or unspecified: Secondary | ICD-10-CM | POA: Diagnosis not present

## 2020-12-15 DIAGNOSIS — Z6791 Unspecified blood type, Rh negative: Secondary | ICD-10-CM | POA: Diagnosis not present

## 2020-12-15 DIAGNOSIS — O209 Hemorrhage in early pregnancy, unspecified: Secondary | ICD-10-CM | POA: Diagnosis not present

## 2020-12-15 LAB — COMPREHENSIVE METABOLIC PANEL WITH GFR
ALT: 21 U/L (ref 0–44)
AST: 20 U/L (ref 15–41)
Albumin: 3.8 g/dL (ref 3.5–5.0)
Alkaline Phosphatase: 53 U/L (ref 38–126)
Anion gap: 6 (ref 5–15)
BUN: 6 mg/dL (ref 6–20)
CO2: 24 mmol/L (ref 22–32)
Calcium: 9 mg/dL (ref 8.9–10.3)
Chloride: 103 mmol/L (ref 98–111)
Creatinine, Ser: 0.82 mg/dL (ref 0.44–1.00)
GFR, Estimated: >60 mL/min
Glucose, Bld: 96 mg/dL (ref 70–99)
Potassium: 4.4 mmol/L (ref 3.5–5.1)
Sodium: 133 mmol/L — ABNORMAL LOW (ref 135–145)
Total Bilirubin: 0.6 mg/dL (ref 0.3–1.2)
Total Protein: 6.8 g/dL (ref 6.5–8.1)

## 2020-12-15 LAB — CBC WITH DIFFERENTIAL/PLATELET
Abs Immature Granulocytes: 0.02 K/uL (ref 0.00–0.07)
Basophils Absolute: 0 K/uL (ref 0.0–0.1)
Basophils Relative: 0 %
Eosinophils Absolute: 0.1 K/uL (ref 0.0–0.5)
Eosinophils Relative: 1 %
HCT: 37.3 % (ref 36.0–46.0)
Hemoglobin: 12.3 g/dL (ref 12.0–15.0)
Immature Granulocytes: 0 %
Lymphocytes Relative: 34 %
Lymphs Abs: 2.9 K/uL (ref 0.7–4.0)
MCH: 29.1 pg (ref 26.0–34.0)
MCHC: 33 g/dL (ref 30.0–36.0)
MCV: 88.4 fL (ref 80.0–100.0)
Monocytes Absolute: 0.7 K/uL (ref 0.1–1.0)
Monocytes Relative: 8 %
Neutro Abs: 4.9 K/uL (ref 1.7–7.7)
Neutrophils Relative %: 57 %
Platelets: 277 K/uL (ref 150–400)
RBC: 4.22 MIL/uL (ref 3.87–5.11)
RDW: 12.4 % (ref 11.5–15.5)
WBC: 8.5 K/uL (ref 4.0–10.5)
nRBC: 0 % (ref 0.0–0.2)

## 2020-12-15 LAB — URINALYSIS, ROUTINE W REFLEX MICROSCOPIC
Bacteria, UA: NONE SEEN
Bilirubin Urine: NEGATIVE
Glucose, UA: NEGATIVE mg/dL
Ketones, ur: NEGATIVE mg/dL
Leukocytes,Ua: NEGATIVE
Nitrite: NEGATIVE
Protein, ur: NEGATIVE mg/dL
Specific Gravity, Urine: 1.01 (ref 1.005–1.030)
pH: 7 (ref 5.0–8.0)

## 2020-12-15 LAB — WET PREP, GENITAL
Clue Cells Wet Prep HPF POC: NONE SEEN
Sperm: NONE SEEN
Trich, Wet Prep: NONE SEEN
Yeast Wet Prep HPF POC: NONE SEEN

## 2020-12-15 LAB — HCG, QUANTITATIVE, PREGNANCY: hCG, Beta Chain, Quant, S: 188 m[IU]/mL — ABNORMAL HIGH

## 2020-12-15 LAB — POCT PREGNANCY, URINE: Preg Test, Ur: POSITIVE — AB

## 2020-12-15 MED ORDER — RHO D IMMUNE GLOBULIN 1500 UNIT/2ML IJ SOSY
300.0000 ug | PREFILLED_SYRINGE | Freq: Once | INTRAMUSCULAR | Status: AC
  Administered 2020-12-15: 300 ug via INTRAMUSCULAR
  Filled 2020-12-15: qty 2

## 2020-12-15 NOTE — Discharge Instructions (Signed)

## 2020-12-15 NOTE — MAU Provider Note (Signed)
History     CSN: [id number]  Arrival date and time: 12/15/20 1142   Event Date/Time   First Provider Initiated Contact with Patient 12/15/20 1235      Chief Complaint  Patient presents with   Abdominal Pain   Vaginal Bleeding   Carly White is a 40 y.o. [phone] at [redacted]w[redacted]d who presents to MAU for vaginal bleeding which began Thursday. Patient endorses heavy bleeding and severe cramping on this day that got progressively better throughout the weekend and today is just having mild cramping that she rates as 1/10 and spotting when wiping only. Patient asked to clarify heavy bleeding she experienced, and patient reports she only had to wear a panty liner during that time.  Blood Type? O NEGATIVE  Pt denies vaginal discharge/odor/itching. Pt denies N/V, abdominal pain, constipation, diarrhea, or urinary problems. Pt denies fever, chills, fatigue, sweating or changes in appetite. Pt denies SOB or chest pain. Pt denies dizziness, HA, light-headedness, weakness.   OB History     Gravida  6   Para  1   Term  1   Preterm      AB  3   Living  1      SAB  2   IAB      Ectopic  1   Multiple      Live Births  1           Past Medical History:  Diagnosis Date   Family history of breast cancer    Family history of cervical cancer    Family history of colon cancer     Past Surgical History:  Procedure Laterality Date   DIAGNOSTIC LAPAROSCOPY WITH REMOVAL OF ECTOPIC PREGNANCY Right 01/28/2019   Procedure: DIAGNOSTIC LAPAROSCOPY  for removal of ectopic pregnancy;  Surgeon: Gerald Leitz, MD;  Location: [facility] OR;  Service: Gynecology;  Laterality: Right;    Family History  Problem Relation Age of Onset   Breast cancer Mother 36   Colon cancer Maternal Uncle 29   Cervical cancer Maternal Grandmother        dx 78s   Colon cancer Maternal Grandfather        dx 39s   Breast cancer Maternal Great-grandmother        dx 46s    Social History   Tobacco Use    Smoking status: Never   Smokeless tobacco: Never  Substance Use Topics   Alcohol use: Never   Drug use: Never    Allergies: No Known Allergies  Medications Prior to Admission  Medication Sig Dispense Refill Last Dose   acetaminophen (TYLENOL) 500 MG tablet Take 1 tablet (500 mg total) by mouth every 8 (eight) hours as needed for mild pain or moderate pain. 30 tablet 0    ibuprofen (ADVIL) 800 MG tablet Take 1 tablet (800 mg total) by mouth every 8 (eight) hours as needed. 30 tablet 0    oxyCODONE (OXY IR/ROXICODONE) 5 MG immediate release tablet Take 1 tablet (5 mg total) by mouth every 6 (six) hours as needed for severe pain. 15 tablet 0     Review of Systems  Constitutional:  Negative for chills, diaphoresis, fatigue and fever.  Eyes:  Negative for visual disturbance.  Respiratory:  Negative for shortness of breath.   Cardiovascular:  Negative for chest pain.  Gastrointestinal:  Negative for abdominal pain, constipation, diarrhea, nausea and vomiting.  Genitourinary:  Positive for pelvic pain and vaginal bleeding. Negative for dysuria, flank pain, frequency, urgency  and vaginal discharge.  Neurological:  Negative for dizziness, weakness, light-headedness and headaches.   Physical Exam   Blood pressure 135/77, pulse 92, temperature 98.4 F (36.9 C), temperature source Oral, resp. rate 16, height 5\' 3"  (1.6 m), weight 80.6 kg, last menstrual period 11/23/2020, SpO2 100 %, unknown if currently breastfeeding.  Patient Vitals for the past 24 hrs:  BP Temp Temp src Pulse Resp SpO2 Height Weight  12/15/20 1227 135/77 -- -- 92 -- -- -- --  12/15/20 1215 133/80 98.4 F (36.9 C) Oral 83 16 100 % 5\' 3"  (1.6 m) 80.6 kg   Physical Exam Vitals and nursing note reviewed.  Constitutional:      General: She is not in acute distress.    Appearance: Normal appearance. She is not ill-appearing, toxic-appearing or diaphoretic.  HENT:     Head: Normocephalic and atraumatic.  Pulmonary:      Effort: Pulmonary effort is normal.  Neurological:     Mental Status: She is alert and oriented to person, place, and time.  Psychiatric:        Mood and Affect: Mood normal.        Behavior: Behavior normal.        Thought Content: Thought content normal.        Judgment: Judgment normal.   Results for orders placed or performed during the hospital encounter of 12/15/20 (from the past 24 hour(s))  Pregnancy, urine POC     Status: Abnormal   Collection Time: 12/15/20 12:09 PM  Result Value Ref Range   Preg Test, Ur POSITIVE (A) NEGATIVE  Urinalysis, Routine w reflex microscopic Urine, Clean Catch     Status: Abnormal   Collection Time: 12/15/20 12:14 PM  Result Value Ref Range   Color, Urine STRAW (A) YELLOW   APPearance CLEAR CLEAR   Specific Gravity, Urine 1.010 1.005 - 1.030   pH 7.0 5.0 - 8.0   Glucose, UA NEGATIVE NEGATIVE mg/dL   Hgb urine dipstick LARGE (A) NEGATIVE   Bilirubin Urine NEGATIVE NEGATIVE   Ketones, ur NEGATIVE NEGATIVE mg/dL   Protein, ur NEGATIVE NEGATIVE mg/dL   Nitrite NEGATIVE NEGATIVE   Leukocytes,Ua NEGATIVE NEGATIVE   RBC / HPF 0-5 0 - 5 RBC/hpf   WBC, UA 0-5 0 - 5 WBC/hpf   Bacteria, UA NONE SEEN NONE SEEN   Squamous Epithelial / LPF 0-5 0 - 5   Mucus PRESENT   CBC with Differential/Platelet     Status: None   Collection Time: 12/15/20 12:45 PM  Result Value Ref Range   WBC 8.5 4.0 - 10.5 K/uL   RBC 4.22 3.87 - 5.11 MIL/uL   Hemoglobin 12.3 12.0 - 15.0 g/dL   HCT 02/15/21 02/15/21 - 34.1 %   MCV 88.4 80.0 - 100.0 fL   MCH 29.1 26.0 - 34.0 pg   MCHC 33.0 30.0 - 36.0 g/dL   RDW 93.7 90.2 - 40.9 %   Platelets 277 150 - 400 K/uL   nRBC 0.0 0.0 - 0.2 %   Neutrophils Relative % 57 %   Neutro Abs 4.9 1.7 - 7.7 K/uL   Lymphocytes Relative 34 %   Lymphs Abs 2.9 0.7 - 4.0 K/uL   Monocytes Relative 8 %   Monocytes Absolute 0.7 0.1 - 1.0 K/uL   Eosinophils Relative 1 %   Eosinophils Absolute 0.1 0.0 - 0.5 K/uL   Basophils Relative 0 %   Basophils  Absolute 0.0 0.0 - 0.1 K/uL   Immature Granulocytes 0 %  Abs Immature Granulocytes 0.02 0.00 - 0.07 K/uL  Comprehensive metabolic panel     Status: Abnormal   Collection Time: 12/15/20 12:45 PM  Result Value Ref Range   Sodium 133 (L) 135 - 145 mmol/L   Potassium 4.4 3.5 - 5.1 mmol/L   Chloride 103 98 - 111 mmol/L   CO2 24 22 - 32 mmol/L   Glucose, Bld 96 70 - 99 mg/dL   BUN 6 6 - 20 mg/dL   Creatinine, Ser 7.82 0.44 - 1.00 mg/dL   Calcium 9.0 8.9 - 95.6 mg/dL   Total Protein 6.8 6.5 - 8.1 g/dL   Albumin 3.8 3.5 - 5.0 g/dL   AST 20 15 - 41 U/L   ALT 21 0 - 44 U/L   Alkaline Phosphatase 53 38 - 126 U/L   Total Bilirubin 0.6 0.3 - 1.2 mg/dL   GFR, Estimated >21 >30 mL/min   Anion gap 6 5 - 15  Rh IG workup (includes ABO/Rh)     Status: None (Preliminary result)   Collection Time: 12/15/20 12:45 PM  Result Value Ref Range   Gestational Age(Wks) 3    ABO/RH(D) O NEG    Antibody Screen NEG    Unit Number [id number]    Blood Component Type RHIG    Unit division 00    Status of Unit ISSUED    Transfusion Status      OK TO TRANSFUSE Performed at [facility], [address], [place], [place] [address]   hCG, quantitative, pregnancy     Status: Abnormal   Collection Time: 12/15/20 12:45 PM  Result Value Ref Range   hCG, Beta Chain, Quant, S 188 (H) <5 mIU/mL  Wet prep, genital     Status: Abnormal   Collection Time: 12/15/20 12:47 PM  Result Value Ref Range   Yeast Wet Prep HPF POC NONE SEEN NONE SEEN   Trich, Wet Prep NONE SEEN NONE SEEN   Clue Cells Wet Prep HPF POC NONE SEEN NONE SEEN   WBC, Wet Prep HPF POC FEW (A) NONE SEEN   Sperm NONE SEEN    US OB LESS THAN 14 WEEKS WITH OB TRANSVAGINAL  Result Date: 12/15/2020 CLINICAL DATA:  Pelvic cramping and vaginal bleeding. Estimated gestational age of [redacted] weeks, 1 day by LMP. EXAM: OBSTETRIC <14 WK [place] AND TRANSVAGINAL OB US TECHNIQUE: Both transabdominal and transvaginal ultrasound examinations were performed for  complete evaluation of the gestation as well as the maternal uterus, adnexal regions, and pelvic cul-de-sac. Transvaginal technique was performed to assess early pregnancy. COMPARISON:  None. FINDINGS: Intrauterine gestational sac: None. Maternal uterus/adnexae: 2.5 x 2.7 x 2.0 cm hemorrhagic cyst in the left ovary. 3.1 x 3.1 x 3.0 cm simple cyst in the left ovary. The right ovary is unremarkable. IMPRESSION: 1. No IUP is visualized. By definition, in the setting of a positive pregnancy test, this reflects a pregnancy of unknown location. Differential considerations include early normal IUP, abnormal IUP/missed abortion, or nonvisualized ectopic pregnancy. Serial beta HCG is suggested. Consider repeat pelvic ultrasound in 14 days. 2. 3.1 cm simple cyst in the left ovary. No follow up imaging recommended. Note: This recommendation does not apply to premenarchal patients or to those with increased risk (genetic, family history, elevated tumor markers or other high-risk factors) of ovarian cancer. Reference: Radiology 2019 Nov; 293(2):359-371. 3. 2.7 cm hemorrhagic cyst in the left ovary. No follow-up imaging recommended. This recommendation follows the consensus statement: Management of Asymptomatic Ovarian and Other Adnexal  Cysts Imaged at US: Society of Radiologists in Ultrasound Consensus Conference Statement. Radiology 2010; [phone]. Electronically Signed   By: Obie DredgeWilliam T Derry M.D.   On: 12/15/2020 13:53    MAU Course  Procedures  MDM -r/o ectopic -UA: straw/ lg hgb, sending urine for culture -CBC: WNL -CMP: no abnormalities requiring treatment -US: PUL -hCG: 188 -ABO: O NEGATIVE, RhoGAM given -WetPrep: WNL -GC/CT collected -Discussed with client the diagnosis of pregnancy of unknown anatomic location.  Three possibilities of outcome are: a healthy pregnancy that is too early to see a yolk sac to confirm the pregnancy is in the uterus, a pregnancy that is not healthy and has not developed and  will not develop, and an ectopic pregnancy that is in the abdomen that cannot be identified at this time.  And ectopic pregnancy can be a life threatening situation as a pregnancy needs to be in the uterus which is a muscle and can stretch to accommodate the growth of a pregnancy.  Other structures in the pelvis and abdomen as not muscular and do not stretch with the growth of a pregnancy.  Worst case scenario is that a structure ruptures with a growing pregnancy not in the uterus and and internal hemorrhage can be a life threatening situation.  We need to follow the progression of this pregnancy carefully.  We need to check another serum pregnancy hormone level to determine if the levels are rising appropriately  and to determine the next steps that are needed for you. Patient's questions were answered. -pt discharged to home in stable condition  Orders Placed This Encounter  Procedures   Wet prep, genital    Standing Status:   Standing    Number of Occurrences:   1   Culture, OB Urine    Standing Status:   Standing    Number of Occurrences:   1   US OB LESS THAN 14 WEEKS WITH OB TRANSVAGINAL    Standing Status:   Standing    Number of Occurrences:   1    Order Specific Question:   Symptom/Reason for Exam    Answer:   Vaginal bleeding in pregnancy [705036]   Urinalysis, Routine w reflex microscopic Urine, Clean Catch    Standing Status:   Standing    Number of Occurrences:   1   CBC with Differential/Platelet    Standing Status:   Standing    Number of Occurrences:   1   Comprehensive metabolic panel    Standing Status:   Standing    Number of Occurrences:   1   hCG, quantitative, pregnancy    Standing Status:   Standing    Number of Occurrences:   1   Pregnancy, urine POC    Standing Status:   Standing    Number of Occurrences:   1   Rh IG workup (includes ABO/Rh)    Standing Status:   Standing    Number of Occurrences:   1    Order Specific Question:   Weeks of Gestation     Answer:   3    Order Specific Question:   RhIG indication:    Answer:   Routine Prenatal or 1st/2nd Trimester Bleeding   Discharge patient    Order Specific Question:   Discharge disposition    Answer:   01-Home or Self Care [1]    Order Specific Question:   Discharge patient date    Answer:   12/15/2020   Meds ordered this encounter  Medications   rho (d) immune globulin (RHIG/RHOPHYLAC) injection 300 mcg   Assessment and Plan   1. Pregnancy of unknown anatomic location   2. Vaginal bleeding in pregnancy   3. Rh negative state in antepartum period     Allergies as of 12/15/2020   No Known Allergies      Medication List     STOP taking these medications    ibuprofen 800 MG tablet Commonly known as: ADVIL       TAKE these medications    acetaminophen 500 MG tablet Commonly known as: TYLENOL Take 1 tablet (500 mg total) by mouth every 8 (eight) hours as needed for mild pain or moderate pain.   oxyCODONE 5 MG immediate release tablet Commonly known as: Oxy IR/ROXICODONE Take 1 tablet (5 mg total) by mouth every 6 (six) hours as needed for severe pain.       -will call with culture results, if positive -safe meds in pregnancy list given -discussed ectopic vs. Early pregnancy vs. miscarriage -strict ectopic precautions given -return MAU precautions -f/u on 12/17/2020 at [facility] for repeat hCG, Dr. Connye Burkitt notified and patient advised to call office at [facility] tomorrow morning to schedule appointment -pt discharged to home in stable condition  Joni Reining E Gerrad Welker 12/15/2020, 4:22 PM

## 2020-12-15 NOTE — MAU Note (Signed)
Carly White is a 40 y.o. at [redacted]w[redacted]d here in MAU reporting: on Thursday had extreme cramping and vaginal bleeding. Has continued to have light bleeding since them with mild cramping.   LMP: 11/23/20  Onset of complaint: ongoing  Pain score: 1/10  Vitals:   12/15/20 1215  BP: 133/80  Pulse: 83  Resp: 16  Temp: 98.4 F (36.9 C)  SpO2: 100%     Lab orders placed from triage: UPT, UA

## 2020-12-16 LAB — RH IG WORKUP (INCLUDES ABO/RH)
ABO/RH(D): O NEG
Antibody Screen: NEGATIVE
Gestational Age(Wks): 3
Unit division: 0

## 2020-12-16 LAB — GC/CHLAMYDIA PROBE AMP (~~LOC~~) NOT AT ARMC
Chlamydia: NEGATIVE
Comment: NEGATIVE
Comment: NORMAL
Neisseria Gonorrhea: NEGATIVE

## 2020-12-17 DIAGNOSIS — Z3201 Encounter for pregnancy test, result positive: Secondary | ICD-10-CM | POA: Diagnosis not present

## 2020-12-17 LAB — CULTURE, OB URINE: Culture: <10000 — AB

## 2020-12-23 DIAGNOSIS — O039 Complete or unspecified spontaneous abortion without complication: Secondary | ICD-10-CM | POA: Diagnosis not present

## 2021-03-23 ENCOUNTER — Other Ambulatory Visit
Admission: RE | Admit: 2021-03-23 | Discharge: 2021-03-23 | Disposition: A | Discharge status: Home / Self Care | Payer: Federal, State, Local not specified - PPO | Attending: Family Medicine | Admitting: Family Medicine

## 2021-03-23 ENCOUNTER — Other Ambulatory Visit: Payer: Self-pay

## 2021-03-23 ENCOUNTER — Encounter: Payer: Self-pay | Admitting: Family Medicine

## 2021-03-23 ENCOUNTER — Ambulatory Visit: Payer: Federal, State, Local not specified - PPO | Admitting: Family Medicine

## 2021-03-23 VITALS — BP 118/84 | HR 93 | Temp 98.5°F | Ht 63.0 in | Wt 175.0 lb

## 2021-03-23 DIAGNOSIS — Z3200 Encounter for pregnancy test, result unknown: Secondary | ICD-10-CM | POA: Diagnosis not present

## 2021-03-23 DIAGNOSIS — O200 Threatened abortion: Secondary | ICD-10-CM | POA: Insufficient documentation

## 2021-03-23 DIAGNOSIS — O209 Hemorrhage in early pregnancy, unspecified: Secondary | ICD-10-CM | POA: Diagnosis not present

## 2021-03-23 LAB — CBC
HCT: 37.1 % (ref 36.0–46.0)
Hemoglobin: 12.6 g/dL (ref 12.0–15.0)
MCH: 29.3 pg (ref 26.0–34.0)
MCHC: 34 g/dL (ref 30.0–36.0)
MCV: 86.3 fL (ref 80.0–100.0)
Platelets: 303 10*3/uL (ref 150–400)
RBC: 4.3 MIL/uL (ref 3.87–5.11)
RDW: 12.7 % (ref 11.5–15.5)
WBC: 7.3 10*3/uL (ref 4.0–10.5)
nRBC: 0 % (ref 0.0–0.2)

## 2021-03-23 LAB — TYPE AND SCREEN
ABO/RH(D): O NEG
Antibody Screen: NEGATIVE

## 2021-03-23 LAB — POCT URINE PREGNANCY: Preg Test, Ur: POSITIVE — AB

## 2021-03-23 LAB — HCG, QUANTITATIVE, PREGNANCY: hCG, Beta Chain, Quant, S: 4821 m[IU]/mL — ABNORMAL HIGH (ref <5)

## 2021-03-23 NOTE — Patient Instructions (Addendum)
-   Obtain stat labs - Follow-up on Thursday at 10:45 am with Dr. Richardson Dopp at [facility] - Contact for any questions

## 2021-03-23 NOTE — Progress Notes (Signed)
Primary Care / Sports Medicine Office Visit  Patient Information:  Patient ID: Carly White, female DOB: 06/16/1980 Age: 40 y.o. MRN: [id number]   Carly White is a pleasant 41 y.o. 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   [DISCONTINUED] oxyCODONE (OXY IR/ROXICODONE) 5 MG immediate release tablet Take 1 tablet (5 mg total) by mouth every 6 (six) hours as needed for severe pain.   No facility-administered encounter medications on file as of 03/23/2021.   Past Surgical History:  Procedure Laterality Date   DIAGNOSTIC LAPAROSCOPY WITH REMOVAL OF ECTOPIC PREGNANCY Right 01/28/2019   Procedure: DIAGNOSTIC LAPAROSCOPY  for removal of ectopic pregnancy;  Surgeon: Gerald Leitz, MD;  Location: [facility] OR;  Service: Gynecology;  Laterality: Right;    Vitals:   03/23/21  1048  BP: 118/84  Pulse: 93  Temp: 98.5 F (36.9 C)  SpO2: 99%   Vitals:   03/23/21 1048  Weight: 175 lb (79.4 kg)  Height: 5\' 3"  (1.6 m)   Body mass index is 31 kg/m.  No results found.   Independent interpretation of notes and tests performed by another provider:   None  Procedures performed:   None  Pertinent History, Exam, Impression, and Recommendations:   Threatened miscarriage 40 year old female presenting with concern over possible miscarriage.  Patient has had a history of ectopic pregnancy status post laparoscopy 01/2019, spontaneous miscarriage 12/2020.  She states that she took a home pregnancy test 02/20/2021 which was positive, has been asymptomatic since that time until Sunday 10/9 where she noted light spotting, following this she has progressed to heavier bleeding and pelvic cramps, has been passing what she describes as clots.  She denies any urinary symptoms, no nausea, emesis, fevers, chills.  Examination reveals benign cardiopulmonary findings, abdomen is soft, nontender, nondistended, no CVA tenderness, there is tenderness diffusely about the pelvis between mild degree.  A point-of-care urine hCG test performed today was positive.  Our office was able to contact the medical assistant of her current OB/GYN Dr. 12/9 with [facility] physicians who advised quantitative beta-hCG, CBC, type and screen, ABO with Rh.  These were ordered today and placed as stat priority, a follow-up with her OB/GYN was scheduled for Thursday, 03/25/2021.  This information was passed on to the patient vocalized understanding  and is amenable to obtaining blood work and following up as scheduled.  We will follow peripherally on this issue.   Orders & Medications No orders of the defined types were placed in this encounter.  Orders Placed This Encounter  Procedures   ABO AND RH    CBC   POCT urine pregnancy   Type and screen   ABO/Rh     No follow-ups on file.     Jerrol Banana, MD   Primary Care Sports Medicine [facility]

## 2021-03-23 NOTE — Assessment & Plan Note (Signed)
40 year old female presenting with concern over possible miscarriage.  Patient has had a history of ectopic pregnancy status post laparoscopy 01/2019, spontaneous miscarriage 12/2020.  She states that she took a home pregnancy test 02/20/2021 which was positive, has been asymptomatic since that time until Sunday 10/9 where she noted light spotting, following this she has progressed to heavier bleeding and pelvic cramps, has been passing what she describes as clots.  She denies any urinary symptoms, no nausea, emesis, fevers, chills.  Examination reveals benign cardiopulmonary findings, abdomen is soft, nontender, nondistended, no CVA tenderness, there is tenderness diffusely about the pelvis between mild degree.  A point-of-care urine hCG test performed today was positive.  Our office was able to contact the medical assistant of her current OB/GYN Dr. Gerald Leitz with [facility] physicians who advised quantitative beta-hCG, CBC, type and screen, ABO with Rh.  These were ordered today and placed as stat priority, a follow-up with her OB/GYN was scheduled for Thursday, 03/25/2021.  This information was passed on to the patient vocalized understanding and is amenable to obtaining blood work and following up as scheduled.  We will follow peripherally on this issue.

## 2021-03-25 DIAGNOSIS — O039 Complete or unspecified spontaneous abortion without complication: Secondary | ICD-10-CM | POA: Diagnosis not present

## 2021-03-25 DIAGNOSIS — N96 Recurrent pregnancy loss: Secondary | ICD-10-CM | POA: Diagnosis not present

## 2021-04-06 DIAGNOSIS — Z3201 Encounter for pregnancy test, result positive: Secondary | ICD-10-CM | POA: Diagnosis not present

## 2021-05-03 DIAGNOSIS — Z01419 Encounter for gynecological examination (general) (routine) without abnormal findings: Secondary | ICD-10-CM | POA: Diagnosis not present

## 2021-05-03 DIAGNOSIS — Z3202 Encounter for pregnancy test, result negative: Secondary | ICD-10-CM | POA: Diagnosis not present

## 2021-05-19 DIAGNOSIS — N96 Recurrent pregnancy loss: Secondary | ICD-10-CM | POA: Diagnosis not present

## 2021-05-19 DIAGNOSIS — Z3201 Encounter for pregnancy test, result positive: Secondary | ICD-10-CM | POA: Diagnosis not present

## 2021-05-19 DIAGNOSIS — Z3200 Encounter for pregnancy test, result unknown: Secondary | ICD-10-CM | POA: Diagnosis not present

## 2021-05-19 DIAGNOSIS — E288 Other ovarian dysfunction: Secondary | ICD-10-CM | POA: Diagnosis not present

## 2021-05-19 DIAGNOSIS — Z319 Encounter for procreative management, unspecified: Secondary | ICD-10-CM | POA: Diagnosis not present

## 2021-05-27 DIAGNOSIS — Z319 Encounter for procreative management, unspecified: Secondary | ICD-10-CM | POA: Diagnosis not present

## 2021-05-27 DIAGNOSIS — Z3200 Encounter for pregnancy test, result unknown: Secondary | ICD-10-CM | POA: Diagnosis not present

## 2021-05-27 DIAGNOSIS — N96 Recurrent pregnancy loss: Secondary | ICD-10-CM | POA: Diagnosis not present

## 2021-05-31 DIAGNOSIS — Z3200 Encounter for pregnancy test, result unknown: Secondary | ICD-10-CM | POA: Diagnosis not present

## 2021-06-08 DIAGNOSIS — Z319 Encounter for procreative management, unspecified: Secondary | ICD-10-CM | POA: Diagnosis not present

## 2021-06-15 ENCOUNTER — Other Ambulatory Visit: Payer: Self-pay

## 2021-06-15 DIAGNOSIS — Z3201 Encounter for pregnancy test, result positive: Secondary | ICD-10-CM | POA: Insufficient documentation

## 2021-07-06 ENCOUNTER — Encounter: Payer: Self-pay | Admitting: Family Medicine

## 2021-07-06 ENCOUNTER — Other Ambulatory Visit: Payer: Self-pay

## 2021-07-07 ENCOUNTER — Encounter: Payer: Self-pay | Admitting: Family Medicine

## 2021-07-13 ENCOUNTER — Encounter: Payer: Self-pay | Admitting: Family Medicine

## 2021-07-13 ENCOUNTER — Telehealth: Payer: Federal, State, Local not specified - PPO | Admitting: Physician Assistant

## 2021-07-13 DIAGNOSIS — J019 Acute sinusitis, unspecified: Secondary | ICD-10-CM

## 2021-07-13 DIAGNOSIS — B9689 Other specified bacterial agents as the cause of diseases classified elsewhere: Secondary | ICD-10-CM

## 2021-07-13 MED ORDER — FLUTICASONE PROPIONATE 50 MCG/ACT NA SUSP: 2.0000 | Freq: Every day | NASAL | 0 refills | Status: DC

## 2021-07-13 MED ORDER — AMOXICILLIN-POT CLAVULANATE 875-125 MG PO TABS: 1.0000 | ORAL_TABLET | Freq: Two times a day (BID) | ORAL | 0 refills | Status: DC

## 2021-07-13 NOTE — Patient Instructions (Signed)
Carly White, thank you for joining Leeanne Rio, PA-C for today's virtual visit.  While this provider is not your primary care provider (PCP), if your PCP is located in our provider database this encounter information will be shared with them immediately following your visit.  Consent: (Patient) Carly White provided verbal consent for this virtual visit at the beginning of the encounter.  Current Medications:  Current Outpatient Medications:    acetaminophen (TYLENOL) 500 MG tablet, Take 1 tablet (500 mg total) by mouth every 8 (eight) hours as needed for mild pain or moderate pain., Disp: 30 tablet, Rfl: 0   levothyroxine (SYNTHROID) 25 MCG tablet, Take 25 mcg by mouth daily., Disp: , Rfl:    norethindrone-ethinyl estradiol-FE (LOESTRIN FE 1/20) 1-20 MG-MCG tablet, Take 1 tablet by mouth daily., Disp: , Rfl:    Medications ordered in this encounter:  No orders of the defined types were placed in this encounter.    *If you need refills on other medications prior to your next appointment, please contact your pharmacy*  Follow-Up: Call back or seek an in-person evaluation if the symptoms worsen or if the condition fails to improve as anticipated.  Other Instructions Please take antibiotic as directed.  Increase fluid intake.  Use Saline nasal spray.  Take a daily multivitamin. Use the Flonase as directed. Start Vitamin C 1000 mg daily and Vitamin D3 1000 units daily.  Place a humidifier in the bedroom.  Please call or return clinic if symptoms are not improving.  Sinusitis Sinusitis is redness, soreness, and swelling (inflammation) of the paranasal sinuses. Paranasal sinuses are air pockets within the bones of your face (beneath the eyes, the middle of the forehead, or above the eyes). In healthy paranasal sinuses, mucus is able to drain out, and air is able to circulate through them by way of your nose. However, when your paranasal sinuses are inflamed, mucus and air can  become trapped. This can allow bacteria and other germs to grow and cause infection. Sinusitis can develop quickly and last only a short time (acute) or continue over a long period (chronic). Sinusitis that lasts for more than 12 weeks is considered chronic.  CAUSES  Causes of sinusitis include: Allergies. Structural abnormalities, such as displacement of the cartilage that separates your nostrils (deviated septum), which can decrease the air flow through your nose and sinuses and affect sinus drainage. Functional abnormalities, such as when the small hairs (cilia) that line your sinuses and help remove mucus do not work properly or are not present. SYMPTOMS  Symptoms of acute and chronic sinusitis are the same. The primary symptoms are pain and pressure around the affected sinuses. Other symptoms include: Upper toothache. Earache. Headache. Bad breath. Decreased sense of smell and taste. A cough, which worsens when you are lying flat. Fatigue. Fever. Thick drainage from your nose, which often is green and may contain pus (purulent). Swelling and warmth over the affected sinuses. DIAGNOSIS  Your caregiver will perform a physical exam. During the exam, your caregiver may: Look in your nose for signs of abnormal growths in your nostrils (nasal polyps). Tap over the affected sinus to check for signs of infection. View the inside of your sinuses (endoscopy) with a special imaging device with a light attached (endoscope), which is inserted into your sinuses. If your caregiver suspects that you have chronic sinusitis, one or more of the following tests may be recommended: Allergy tests. Nasal culture A sample of mucus is taken from your nose  and sent to a lab and screened for bacteria. Nasal cytology A sample of mucus is taken from your nose and examined by your caregiver to determine if your sinusitis is related to an allergy. TREATMENT  Most cases of acute sinusitis are related to a viral  infection and will resolve on their own within 10 days. Sometimes medicines are prescribed to help relieve symptoms (pain medicine, decongestants, nasal steroid sprays, or saline sprays).  However, for sinusitis related to a bacterial infection, your caregiver will prescribe antibiotic medicines. These are medicines that will help kill the bacteria causing the infection.  Rarely, sinusitis is caused by a fungal infection. In theses cases, your caregiver will prescribe antifungal medicine. For some cases of chronic sinusitis, surgery is needed. Generally, these are cases in which sinusitis recurs more than 3 times per year, despite other treatments. HOME CARE INSTRUCTIONS  Drink plenty of water. Water helps thin the mucus so your sinuses can drain more easily. Use a humidifier. Inhale steam 3 to 4 times a day (for example, sit in the bathroom with the shower running). Apply a warm, moist washcloth to your face 3 to 4 times a day, or as directed by your caregiver. Use saline nasal sprays to help moisten and clean your sinuses. Take over-the-counter or prescription medicines for pain, discomfort, or fever only as directed by your caregiver. SEEK IMMEDIATE MEDICAL CARE IF: You have increasing pain or severe headaches. You have nausea, vomiting, or drowsiness. You have swelling around your face. You have vision problems. You have a stiff neck. You have difficulty breathing. MAKE SURE YOU:  Understand these instructions. Will watch your condition. Will get help right away if you are not doing well or get worse. Document Released: 05/30/2005 Document Revised: 08/22/2011 Document Reviewed: 06/14/2011 [facility] Patient Information 2014 [place], [place].    If you have been instructed to have an in-person evaluation today at a local Urgent Care facility, please use the link below. It will take you to a list of all of our available [facility], including address, phone number and hours of  operation. Please do not delay care.  [facility]  If you or a family member do not have a primary care provider, use the link below to schedule a visit and establish care. When you choose a [facility] primary care physician or advanced practice provider, you gain a long-term partner in health. Find a Primary Care Provider  Learn more about [facility]'s in-office and virtual care options: [facility]

## 2021-07-13 NOTE — Progress Notes (Signed)
Virtual Visit Consent   URI COVEY, you are scheduled for a virtual visit with a [facility] provider today.     Just as with appointments in the office, your consent must be obtained to participate.  Your consent will be active for this visit and any virtual visit you may have with one of our providers in the next 365 days.     If you have a MyChart account, a copy of this consent can be sent to you electronically.  All virtual visits are billed to your insurance company just like a traditional visit in the office.    As this is a virtual visit, video technology does not allow for your provider to perform a traditional examination.  This may limit your provider's ability to fully assess your condition.  If your provider identifies any concerns that need to be evaluated in person or the need to arrange testing (such as labs, EKG, etc.), we will make arrangements to do so.     Although advances in technology are sophisticated, we cannot ensure that it will always work on either your end or our end.  If the connection with a video visit is poor, the visit may have to be switched to a telephone visit.  With either a video or telephone visit, we are not always able to ensure that we have a secure connection.     I need to obtain your verbal consent now.   Are you willing to proceed with your visit today?    Carly White has provided verbal consent on 07/13/2021 for a virtual visit (video or telephone).   Carly White, [place]   Date: 07/13/2021 11:18 AM   Virtual Visit via Video Note   I, Carly White, connected with  Carly White  ([id number], [birth year]) on 07/13/21 at 11:15 AM EST by a video-enabled telemedicine application and verified that I am speaking with the correct person using two identifiers.  Location: Patient: Virtual Visit Location Patient: Home Provider: Virtual Visit Location Provider: Home Office   I discussed the limitations of evaluation and  management by telemedicine and the availability of in person appointments. The patient expressed understanding and agreed to proceed.    History of Present Illness: Carly White is a 41 y.o. who identifies as a female who was assigned female at birth, and is being seen today for possible sinusitis. Notes symptoms for the past 1.5 weeks with nasal congestion, sinus pressure and discomfort and cough. Denies fever, chills or aches. Symptoms continue to progress. Notes negative COVID testing. Has history of sinusitis and notes this feels similar to previous episodes. Denies concern for pregnancy. Is not breastfeeding.   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   Strain of lumbar paraspinal muscle 07/16/2020   Family history of breast cancer    Family history of colon cancer    Family history of cervical cancer    Ectopic pregnancy 01/28/2019   Amenorrhea, secondary 07/10/2014    Allergies:  Allergies  Allergen Reactions   Covid-19 (Mrna) Vaccine Other (See Comments)   Medications:  Current Outpatient Medications:    amoxicillin-clavulanate (AUGMENTIN) 875-125 MG tablet, Take 1 tablet by mouth 2 (two) times daily., Disp: 14 tablet, Rfl: 0   fluticasone (FLONASE) 50 MCG/ACT nasal spray, Place 2 sprays into both nostrils daily., Disp: 16  g, Rfl: 0   acetaminophen (TYLENOL) 500 MG tablet, Take 1 tablet (500 mg total) by mouth every 8 (eight) hours as needed for mild pain or moderate pain., Disp: 30 tablet, Rfl: 0   levothyroxine (SYNTHROID) 25 MCG tablet, Take 25 mcg by mouth daily., Disp: , Rfl:    norethindrone-ethinyl estradiol-FE (LOESTRIN FE 1/20) 1-20 MG-MCG tablet, Take 1 tablet by mouth daily., Disp: , Rfl:   Observations/Objective: Patient is well-developed, well-nourished in no acute distress.  Resting comfortably at home.  Head is  normocephalic, atraumatic.  No labored breathing. Speech is clear and coherent with logical content.  Patient is alert and oriented at baseline.   Assessment and Plan: 1. Acute bacterial sinusitis - amoxicillin-clavulanate (AUGMENTIN) 875-125 MG tablet; Take 1 tablet by mouth 2 (two) times daily.  Dispense: 14 tablet; Refill: 0 - fluticasone (FLONASE) 50 MCG/ACT nasal spray; Place 2 sprays into both nostrils daily.  Dispense: 16 g; Refill: 0  Rx Augmentin.  Increase fluids.  Rest.  Saline nasal spray.  Probiotic.  Mucinex as directed.  Humidifier in bedroom. Flonase per orders.  Call or return to clinic if symptoms are not improving.   Follow Up Instructions: I discussed the assessment and treatment plan with the patient. The patient was provided an opportunity to ask questions and all were answered. The patient agreed with the plan and demonstrated an understanding of the instructions.  A copy of instructions were sent to the patient via MyChart unless otherwise noted below.   The patient was advised to call back or seek an in-person evaluation if the symptoms worsen or if the condition fails to improve as anticipated.  Time:  I spent 10 minutes with the patient via telehealth technology discussing the above problems/concerns.    Carly Climes, PA-C

## 2021-09-28 DIAGNOSIS — N939 Abnormal uterine and vaginal bleeding, unspecified: Secondary | ICD-10-CM | POA: Diagnosis not present

## 2021-09-28 DIAGNOSIS — Z3041 Encounter for surveillance of contraceptive pills: Secondary | ICD-10-CM | POA: Diagnosis not present

## 2021-09-29 DIAGNOSIS — N939 Abnormal uterine and vaginal bleeding, unspecified: Secondary | ICD-10-CM | POA: Diagnosis not present

## 2022-04-13 DIAGNOSIS — J0140 Acute pansinusitis, unspecified: Secondary | ICD-10-CM | POA: Diagnosis not present

## 2022-06-10 DIAGNOSIS — K080 Exfoliation of teeth due to systemic causes: Secondary | ICD-10-CM | POA: Diagnosis not present

## 2022-07-08 ENCOUNTER — Encounter: Payer: Self-pay | Admitting: Family Medicine

## 2022-07-08 ENCOUNTER — Ambulatory Visit (INDEPENDENT_AMBULATORY_CARE_PROVIDER_SITE_OTHER): Payer: Federal, State, Local not specified - PPO | Admitting: Family Medicine

## 2022-07-08 VITALS — BP 124/80 | HR 80 | Ht 63.0 in | Wt 173.0 lb

## 2022-07-08 DIAGNOSIS — Z1231 Encounter for screening mammogram for malignant neoplasm of breast: Secondary | ICD-10-CM

## 2022-07-08 DIAGNOSIS — Z13220 Encounter for screening for lipoid disorders: Secondary | ICD-10-CM

## 2022-07-08 DIAGNOSIS — Z0000 Encounter for general adult medical examination without abnormal findings: Secondary | ICD-10-CM | POA: Diagnosis not present

## 2022-07-08 DIAGNOSIS — N911 Secondary amenorrhea: Secondary | ICD-10-CM

## 2022-07-08 DIAGNOSIS — J329 Chronic sinusitis, unspecified: Secondary | ICD-10-CM | POA: Insufficient documentation

## 2022-07-08 DIAGNOSIS — Z1159 Encounter for screening for other viral diseases: Secondary | ICD-10-CM

## 2022-07-08 DIAGNOSIS — Z803 Family history of malignant neoplasm of breast: Secondary | ICD-10-CM

## 2022-07-08 HISTORY — DX: Encounter for general adult medical examination without abnormal findings: Z00.00

## 2022-07-08 NOTE — Patient Instructions (Addendum)
-  Obtain fasting labs with orders provided (can have water or black coffee but otherwise no food or drink x 8 hours before labs) - Review information provided - Attend eye doctor annually, dentist every 6 months, work towards or maintain 30 minutes of moderate intensity physical activity at least 5 days per week, and consume a balanced diet - Return in 1 year for physical - Contact us for any questions between now and then  Additionally: - Use Flonase & Claritin/Zyrtec daily x 7 days then as-needed - If symptoms persist / worsen, contact us for visit

## 2022-07-08 NOTE — Assessment & Plan Note (Signed)
Chronic issue, currently stable, most likely in setting of chronic allergic rhinitis which was conveyed to patient in addition to OTC treatment regimen and criteria for follow-up visit.

## 2022-07-08 NOTE — Progress Notes (Signed)
Annual Physical Exam Visit  Patient Information:  Patient ID: Carly White, female DOB: Aug 03, 1980 Age: 42 y.o. MRN: [id number]   Subjective:   CC: Annual Physical Exam  HPI:  Carly White is here for their annual physical.  I reviewed the past medical history, family history, social history, surgical history, and allergies today and changes were made as necessary.  Please see the problem list section below for additional details.  Past Medical History: Past Medical History:  Diagnosis Date   Annual physical exam 07/08/2022   Ectopic pregnancy 01/28/2019   Family history of breast cancer    Family history of cervical cancer    Family history of colon cancer    Past Surgical History: Past Surgical History:  Procedure Laterality Date   DIAGNOSTIC LAPAROSCOPY WITH REMOVAL OF ECTOPIC PREGNANCY Right 01/28/2019   Procedure: DIAGNOSTIC LAPAROSCOPY  for removal of ectopic pregnancy;  Surgeon: Christophe Louis, MD;  Location: [facility];  Service: Gynecology;  Laterality: Right;   Family History: Family History  Problem Relation Age of Onset   Breast cancer Mother 75   Cancer Mother    Colon cancer Maternal Uncle 45   Cervical cancer Maternal Grandmother        dx 68s   Colon cancer Maternal Grandfather        dx 30s   Diabetes Paternal Grandfather    Breast cancer Maternal Great-grandmother        dx 75s   Allergies: Allergies  Allergen Reactions   Covid-19 (Mrna) Vaccine Other (See Comments)   Health Maintenance: Health Maintenance  Topic Date Due   Hepatitis C Screening  Never done   PAP SMEAR-Modifier  04/27/2026   DTaP/Tdap/Td (2 - Td or Tdap) 01/11/2027   INFLUENZA VACCINE  Completed   HIV Screening  Completed   HPV VACCINES  Aged Out   COVID-19 Vaccine  Discontinued    HM Colonoscopy     This patient has no relevant Health Maintenance data.      Medications: No current outpatient medications on file prior to visit.   No current  facility-administered medications on file prior to visit.    Review of Systems: No headache, visual changes, nausea, vomiting, diarrhea, constipation, dizziness, abdominal pain, skin rash, fevers, chills, night sweats, swollen lymph nodes, weight loss, chest pain, body aches, joint swelling, muscle aches, shortness of breath, mood changes, visual or auditory hallucinations reported.  Objective:   Vitals:   07/08/22 1013  BP: 124/80  Pulse: 80  SpO2: 98%   Vitals:   07/08/22 1013  Weight: 173 lb (78.5 kg)  Height: 5\' 3"  (1.6 m)   Body mass index is 30.65 kg/m.  General: Well Developed, well nourished, and in no acute distress.  Neuro: Alert and oriented x3, extra-ocular muscles intact, sensation grossly intact. Cranial nerves II through XII are grossly intact, motor, sensory, and coordinative functions are intact. HEENT: Normocephalic, atraumatic, pupils equal round reactive to light, neck supple, no masses, no lymphadenopathy, thyroid nonpalpable. Oropharynx, nasopharynx with mildly erythematous and nonswollen turbinates, otherwise external ear canals are unremarkable. Skin: Warm and dry, no rashes noted.  Cardiac: Regular rate and rhythm, no murmurs rubs or gallops. No peripheral edema. Pulses symmetric. Respiratory: Clear to auscultation bilaterally. Not using accessory muscles, speaking in full sentences.  Abdominal: Soft, nontender, nondistended, positive bowel sounds, no masses, no organomegaly. Musculoskeletal: Shoulder, elbow, wrist, hip, knee, ankle stable, and with full range of motion.  Female chaperone initials: KG present throughout the  physical examination.  Impression and Recommendations:   The patient was counselled, risk factors were discussed, and anticipatory guidance given.  Problem List Items Addressed This Visit       Respiratory   Recurrent sinus infections    Chronic issue, currently stable, most likely in setting of chronic allergic rhinitis which was  conveyed to patient in addition to OTC treatment regimen and criteria for follow-up visit.        Other   Family history of breast cancer    Screening mammogram ordered.      Relevant Orders   MM 3D SCREEN BREAST BILATERAL   Annual physical exam - Primary    Annual examination completed, risk stratification labs ordered, anticipatory guidance provided.  We will follow labs once resulted.      Relevant Orders   CBC   Comprehensive metabolic panel   Hepatitis C antibody   Lipid panel   TSH   Amenorrhea, secondary    Resolving/resolved, has stopped OCPs, most recent cycle has been regular.  Follows with gynecology.      Other Visit Diagnoses     Need for hepatitis C screening test       Relevant Orders   Hepatitis C antibody   Screening for lipoid disorders       Relevant Orders   Comprehensive metabolic panel   Lipid panel   Encounter for screening mammogram for malignant neoplasm of breast       Relevant Orders   MM 3D SCREEN BREAST BILATERAL        Orders & Medications Medications: No orders of the defined types were placed in this encounter.  Orders Placed This Encounter  Procedures   MM 3D SCREEN BREAST BILATERAL   CBC   Comprehensive metabolic panel   Hepatitis C antibody   Lipid panel   TSH     Return in about 1 year (around 07/09/2023) for CPE.    Montel Culver, MD, [facility]   Primary Care Sports Medicine Primary Care and Sports Medicine at [facility]

## 2022-07-08 NOTE — Assessment & Plan Note (Signed)
Annual examination completed, risk stratification labs ordered, anticipatory guidance provided.  We will follow labs once resulted. 

## 2022-07-08 NOTE — Assessment & Plan Note (Signed)
Resolving/resolved, has stopped OCPs, most recent cycle has been regular.  Follows with gynecology.

## 2022-07-08 NOTE — Assessment & Plan Note (Signed)
Screening mammogram ordered

## 2022-07-09 LAB — CBC
Hematocrit: 40.1 % (ref 34.0–46.6)
Hemoglobin: 12.8 g/dL (ref 11.1–15.9)
MCH: 28.7 pg (ref 26.6–33.0)
MCHC: 31.9 g/dL (ref 31.5–35.7)
MCV: 90 fL (ref 79–97)
Platelets: 361 10*3/uL (ref 150–450)
RBC: 4.46 x10E6/uL (ref 3.77–5.28)
RDW: 11.9 % (ref 11.7–15.4)
WBC: 6 10*3/uL (ref 3.4–10.8)

## 2022-07-09 LAB — COMPREHENSIVE METABOLIC PANEL
ALT: 17 IU/L (ref 0–32)
AST: 19 IU/L (ref 0–40)
Albumin/Globulin Ratio: 1.7 (ref 1.2–2.2)
Albumin: 4.5 g/dL (ref 3.9–4.9)
Alkaline Phosphatase: 69 IU/L (ref 44–121)
BUN/Creatinine Ratio: 12 (ref 9–23)
BUN: 10 mg/dL (ref 6–24)
Bilirubin Total: 0.2 mg/dL (ref 0.0–1.2)
CO2: 22 mmol/L (ref 20–29)
Calcium: 9.4 mg/dL (ref 8.7–10.2)
Chloride: 103 mmol/L (ref 96–106)
Creatinine, Ser: 0.82 mg/dL (ref 0.57–1.00)
Globulin, Total: 2.6 g/dL (ref 1.5–4.5)
Glucose: 87 mg/dL (ref 70–99)
Potassium: 4.3 mmol/L (ref 3.5–5.2)
Sodium: 139 mmol/L (ref 134–144)
Total Protein: 7.1 g/dL (ref 6.0–8.5)
eGFR: 93 mL/min/{1.73_m2} (ref >59)

## 2022-07-09 LAB — LIPID PANEL
Chol/HDL Ratio: 3.1 ratio (ref 0.0–4.4)
Cholesterol, Total: 163 mg/dL (ref 100–199)
HDL: 53 mg/dL (ref >39)
LDL Chol Calc (NIH): 100 mg/dL — ABNORMAL HIGH (ref 0–99)
Triglycerides: 45 mg/dL (ref 0–149)
VLDL Cholesterol Cal: 10 mg/dL (ref 5–40)

## 2022-07-09 LAB — TSH: TSH: 2.41 u[IU]/mL (ref 0.450–4.500)

## 2022-07-09 LAB — HEPATITIS C ANTIBODY: Hep C Virus Ab: NONREACTIVE

## 2022-11-01 ENCOUNTER — Ambulatory Visit
Admission: RE | Admit: 2022-11-01 | Discharge: 2022-11-01 | Disposition: A | Discharge status: Home / Self Care | Payer: Federal, State, Local not specified - PPO | Source: Ambulatory Visit | Attending: Family Medicine | Admitting: Family Medicine

## 2022-11-01 DIAGNOSIS — Z803 Family history of malignant neoplasm of breast: Secondary | ICD-10-CM | POA: Diagnosis not present

## 2022-11-01 DIAGNOSIS — Z1231 Encounter for screening mammogram for malignant neoplasm of breast: Secondary | ICD-10-CM | POA: Diagnosis not present

## 2022-11-02 ENCOUNTER — Other Ambulatory Visit: Payer: Self-pay | Admitting: Family Medicine

## 2022-11-02 DIAGNOSIS — N6489 Other specified disorders of breast: Secondary | ICD-10-CM

## 2022-11-02 DIAGNOSIS — R928 Other abnormal and inconclusive findings on diagnostic imaging of breast: Secondary | ICD-10-CM

## 2022-11-04 IMAGING — US US OB < 14 WEEKS - US OB TV
1 series · 15 of 28 positions shown · non-contrast
Comparison: None.

CLINICAL DATA: Pelvic cramping and vaginal bleeding. Estimated
gestational age of 3 weeks, 1 day by LMP.

EXAM:
OBSTETRIC <14 WK US AND TRANSVAGINAL OB US
TECHNIQUE: Both transabdominal and transvaginal ultrasound examinations were
performed for complete evaluation of the gestation as well as the
maternal uterus, adnexal regions, and pelvic cul-de-sac.
Transvaginal technique was performed to assess early pregnancy.

[Series 1: us ob < 14 weeks - us ob tv · 48 acquisitions, 15 frames shown]
[im 1/48]
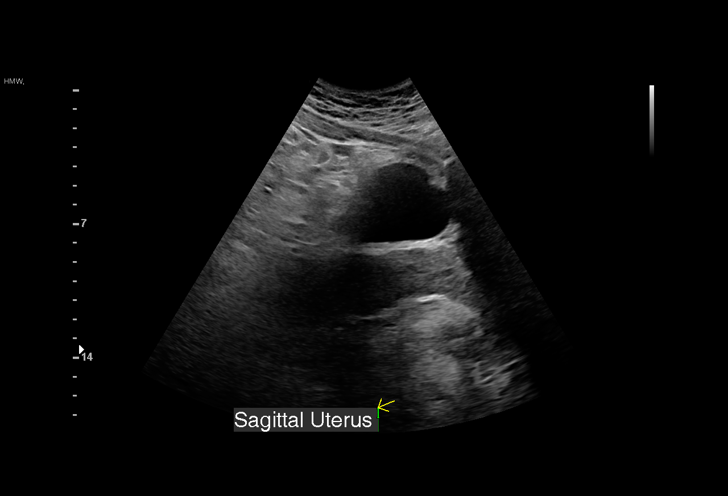
[im 4/48]
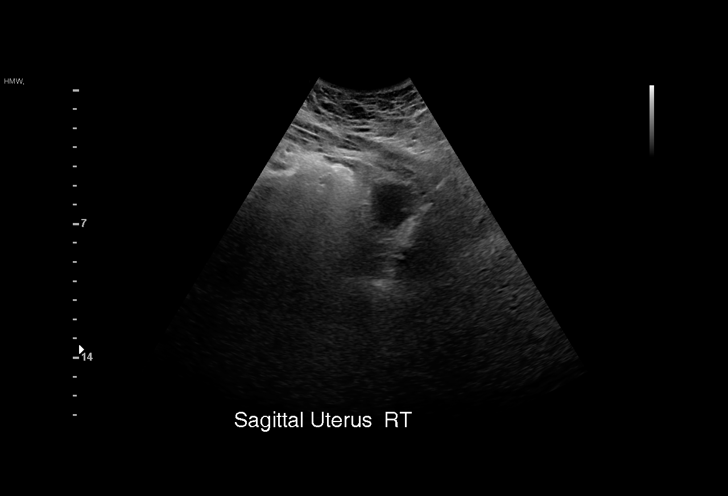
[im 7/48]
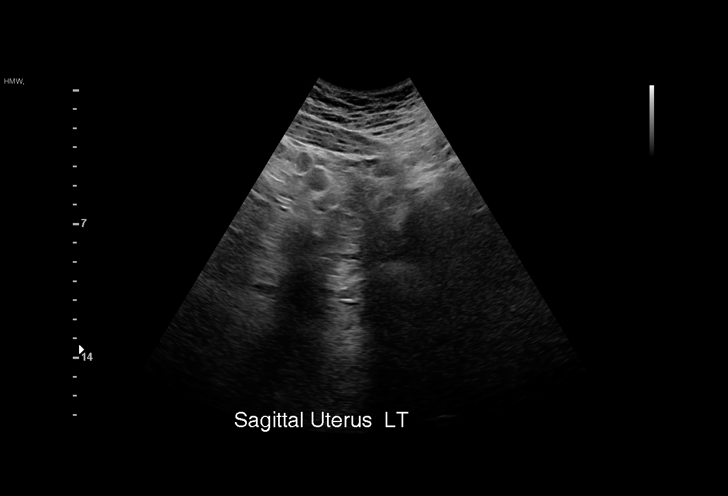
[im 11/48]
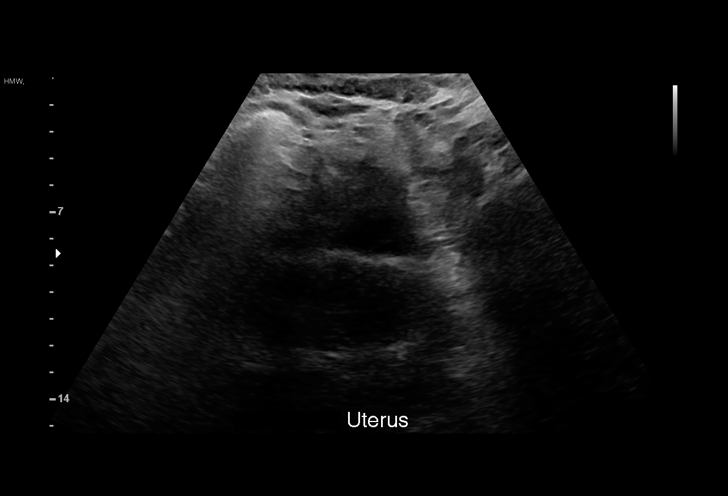
[im 14/48]
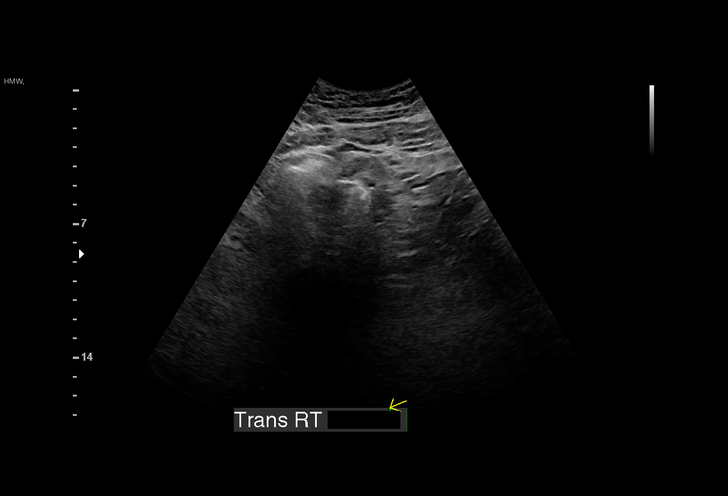
[im 18/48]
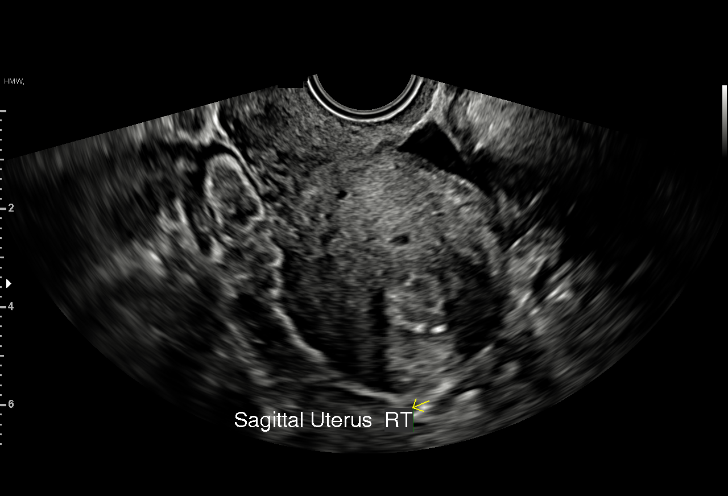
[im 21/48]
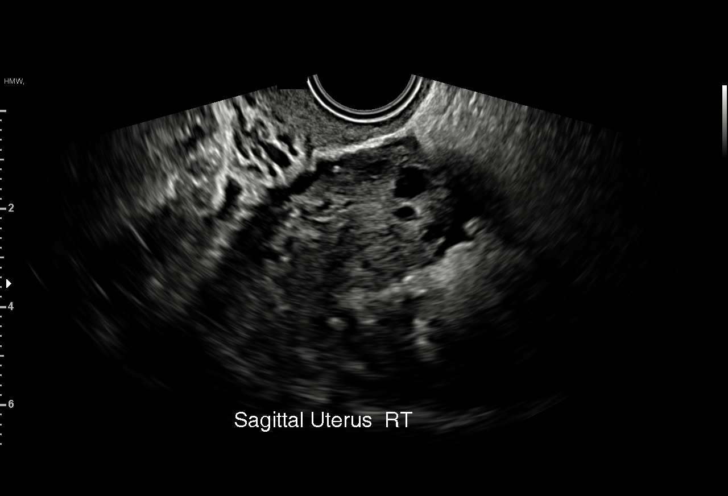
[im 25/48]
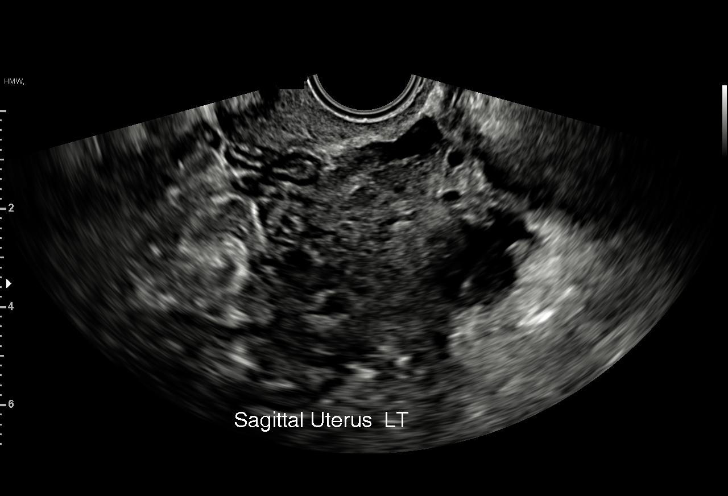
[im 27/48]
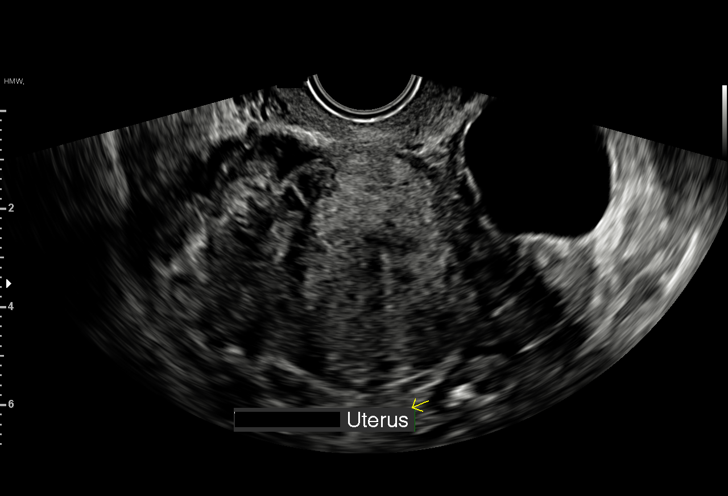
[im 30/48]
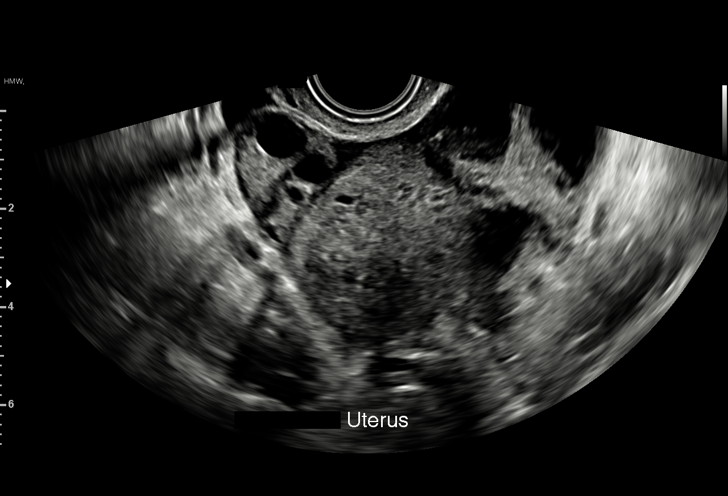
[im 34/48]
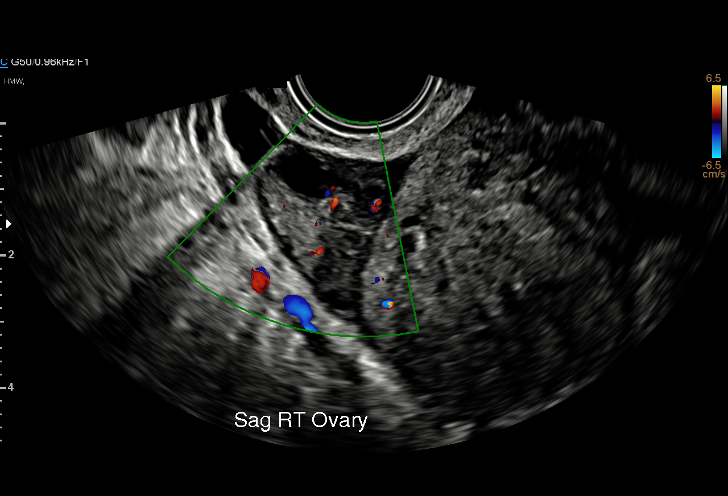
[im 37/48]
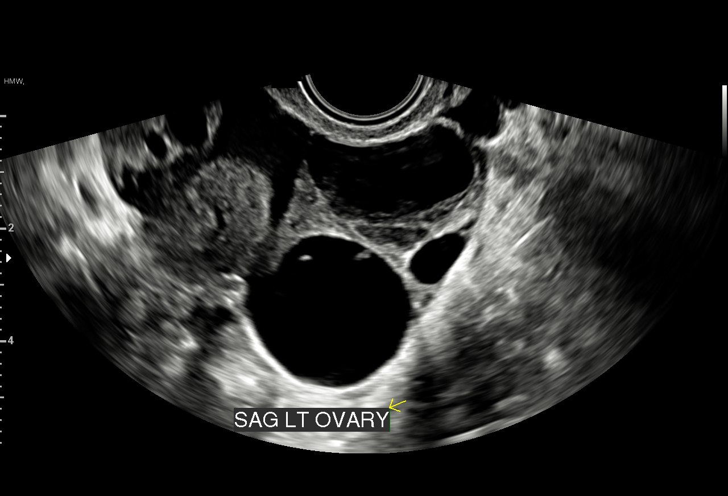
[im 41/48]
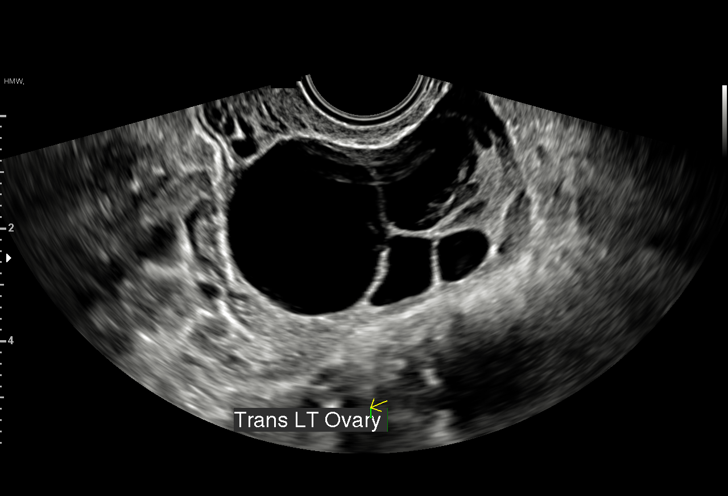
[im 44/48]
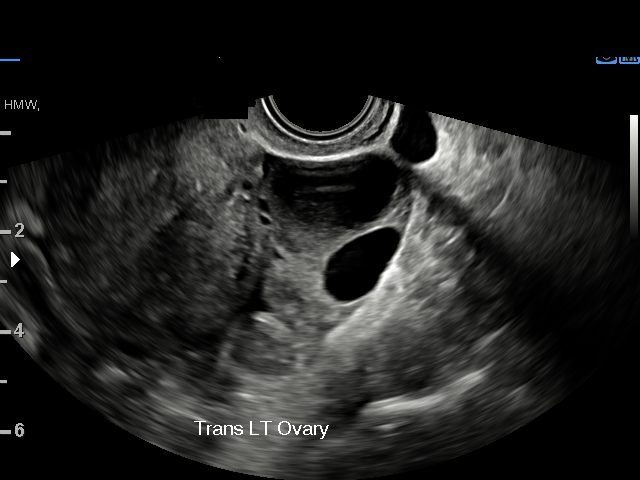
[im 48/48]
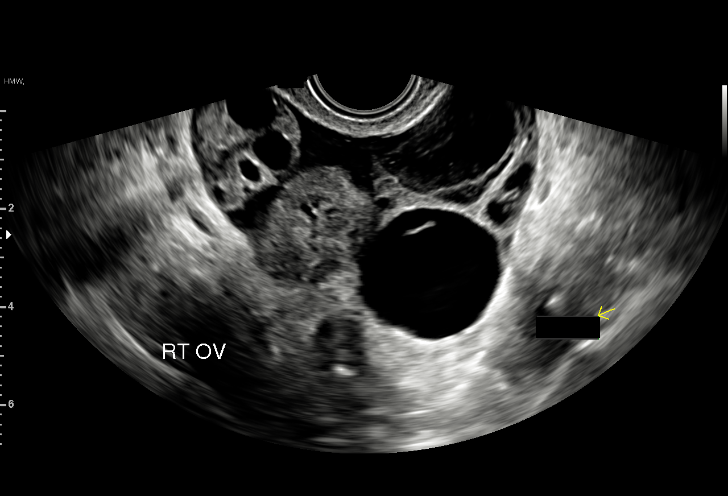

[15 of 28 positions shown; findings below may reference images not displayed]

FINDINGS: Intrauterine gestational sac: None.

Maternal uterus/adnexae: 2.5 x 2.7 x 2.0 cm hemorrhagic cyst in the
left ovary. 3.1 x 3.1 x 3.0 cm simple cyst in the left ovary. The
right ovary is unremarkable.
IMPRESSION: 1. No IUP is visualized. By definition, in the setting of a positive
pregnancy test, this reflects a pregnancy of unknown location.
Differential considerations include early normal IUP, abnormal
IUP/missed abortion, or nonvisualized ectopic pregnancy. Serial beta
HCG is suggested. Consider repeat pelvic ultrasound in 14 days.
2. 3.1 cm simple cyst in the left ovary. No follow up imaging
recommended. Note: This recommendation does not apply to
premenarchal patients or to those with increased risk (genetic,
family history, elevated tumor markers or other high-risk factors)
of ovarian cancer. Reference: Radiology [DATE]):[id number].
3. 2.7 cm hemorrhagic cyst in the left ovary. No follow-up imaging
recommended. This recommendation follows the consensus statement:
Management of Asymptomatic Ovarian and Other Adnexal Cysts Imaged at
US: Society of Radiologists in Ultrasound Consensus Conference

## 2022-11-09 ENCOUNTER — Other Ambulatory Visit: Payer: Federal, State, Local not specified - PPO

## 2022-11-09 ENCOUNTER — Inpatient Hospital Stay: Admission: RE | Admit: 2022-11-09 | Payer: Federal, State, Local not specified - PPO | Source: Ambulatory Visit

## 2022-11-10 ENCOUNTER — Ambulatory Visit
Admission: RE | Admit: 2022-11-10 | Discharge: 2022-11-10 | Disposition: A | Discharge status: Home / Self Care | Payer: Federal, State, Local not specified - PPO | Source: Ambulatory Visit | Attending: Family Medicine | Admitting: Family Medicine

## 2022-11-10 DIAGNOSIS — R928 Other abnormal and inconclusive findings on diagnostic imaging of breast: Secondary | ICD-10-CM

## 2022-11-10 DIAGNOSIS — R92321 Mammographic fibroglandular density, right breast: Secondary | ICD-10-CM | POA: Diagnosis not present

## 2022-11-10 DIAGNOSIS — N6489 Other specified disorders of breast: Secondary | ICD-10-CM | POA: Insufficient documentation

## 2023-07-11 ENCOUNTER — Encounter: Payer: Self-pay | Admitting: Family Medicine

## 2023-07-11 ENCOUNTER — Ambulatory Visit (INDEPENDENT_AMBULATORY_CARE_PROVIDER_SITE_OTHER): Payer: Federal, State, Local not specified - PPO | Admitting: Family Medicine

## 2023-07-11 VITALS — BP 118/70 | HR 87 | Ht 63.0 in | Wt 184.4 lb

## 2023-07-11 DIAGNOSIS — Z0000 Encounter for general adult medical examination without abnormal findings: Secondary | ICD-10-CM | POA: Diagnosis not present

## 2023-07-11 DIAGNOSIS — Z136 Encounter for screening for cardiovascular disorders: Secondary | ICD-10-CM | POA: Diagnosis not present

## 2023-07-11 DIAGNOSIS — R7309 Other abnormal glucose: Secondary | ICD-10-CM

## 2023-07-11 DIAGNOSIS — R200 Anesthesia of skin: Secondary | ICD-10-CM | POA: Insufficient documentation

## 2023-07-11 DIAGNOSIS — N911 Secondary amenorrhea: Secondary | ICD-10-CM

## 2023-07-11 DIAGNOSIS — M5412 Radiculopathy, cervical region: Secondary | ICD-10-CM | POA: Insufficient documentation

## 2023-07-11 MED ORDER — METHOCARBAMOL 500 MG PO TABS
500.0000 mg | ORAL_TABLET | Freq: Every evening | ORAL | 0 refills | Status: DC | PRN
Start: 2023-07-11 — End: 2023-08-09

## 2023-07-11 MED ORDER — MELOXICAM 15 MG PO TABS
15.0000 mg | ORAL_TABLET | Freq: Every day | ORAL | 0 refills | Status: DC
Start: 2023-07-11 — End: 2023-08-30

## 2023-07-11 NOTE — Progress Notes (Signed)
Annual Physical Exam Visit  Patient Information:  Patient ID: Carly White, female DOB: 1980/11/23 Age: 43 y.o. MRN: [id number]   Subjective:   CC: Annual Physical Exam  HPI:  Carly White is here for their annual physical.  I reviewed the past medical history, family history, social history, surgical history, and allergies today and changes were made as necessary.  Please see the problem list section below for additional details.  Past Medical History: Past Medical History:  Diagnosis Date   Annual physical exam 07/08/2022   Ectopic pregnancy 01/28/2019   Family history of breast cancer    Family history of cervical cancer    Family history of colon cancer    Past Surgical History: Past Surgical History:  Procedure Laterality Date   DIAGNOSTIC LAPAROSCOPY WITH REMOVAL OF ECTOPIC PREGNANCY Right 01/28/2019   Procedure: DIAGNOSTIC LAPAROSCOPY  for removal of ectopic pregnancy;  Surgeon: Gerald Leitz, MD;  Location: [facility] OR;  Service: Gynecology;  Laterality: Right;   Family History: Family History  Problem Relation Age of Onset   Breast cancer Mother 97   Cancer Mother    Colon cancer Maternal Uncle 57   Breast cancer Maternal Grandmother    Cervical cancer Maternal Grandmother        dx 72s   Colon cancer Maternal Grandfather        dx 61s   Diabetes Paternal Grandfather    Breast cancer Maternal Great-grandmother        dx 32s   Allergies: Allergies  Allergen Reactions   Covid-19 (Mrna) Vaccine Other (See Comments)   Health Maintenance: Health Maintenance  Topic Date Due   Cervical Cancer Screening (HPV/Pap Cotest)  04/29/2024   DTaP/Tdap/Td (2 - Td or Tdap) 01/11/2027   INFLUENZA VACCINE  Completed   Hepatitis C Screening  Completed   HIV Screening  Completed   HPV VACCINES  Aged Out   COVID-19 Vaccine  Discontinued    HM Colonoscopy   This patient has no relevant Health Maintenance data.    Medications: No current outpatient medications on  file prior to visit.   No current facility-administered medications on file prior to visit.    Objective:   Vitals:   07/11/23 0857  BP: 118/70  Pulse: 87  SpO2: 99%   Vitals:   07/11/23 0857  Weight: 184 lb 6.4 oz (83.6 kg)  Height: 5\' 3"  (1.6 m)   Body mass index is 32.66 kg/m.  General: Well Developed, well nourished, and in no acute distress.  Neuro: Alert and oriented x3, extra-ocular muscles intact, sensation grossly intact. Cranial nerves II through XII are grossly intact, motor, sensory, and coordinative functions are intact. HEENT: Normocephalic, atraumatic, neck supple, no masses, no lymphadenopathy, thyroid nonenlarged. Oropharynx, nasopharynx, external ear canals are unremarkable. Skin: Warm and dry, no rashes noted.  Cardiac: Regular rate and rhythm, no murmurs rubs or gallops. No peripheral edema. Pulses symmetric. Respiratory: Clear to auscultation bilaterally. Speaking in full sentences.  Abdominal: Soft, nontender, nondistended, positive bowel sounds, no masses, no organomegaly. Musculoskeletal: Stable, and with full range of motion.  Positive Spurling's left, positive Tinel's and Phalen's bilaterally, paraspinal muscular tenderness about the left SCM, upper trapezius and levator scapula.   Impression and Recommendations:   The patient was counselled, risk factors were discussed, and anticipatory guidance given.  Problem List Items Addressed This Visit     Amenorrhea, secondary   Amenorrhea Resolved. Menstrual cycle has returned to regularity without intervention. -No further action  required at this time.      Bilateral hand numbness   See additional assessment(s) for plan details.      Healthcare maintenance - Primary   Annual examination completed, risk stratification labs ordered, anticipatory guidance provided.  We will follow labs once resulted.  General Health Maintenance -Schedule cervical cancer screening (Pap smear) with gynecology. -Order  routine labs.      Relevant Orders   CBC   Ambulatory referral to Obstetrics / Gynecology   Left cervical radiculopathy   Cervical Radiculopathy -left Recent onset (1-2-week) of neck pain with radiation to the left upper extremity. Positive Spurling's sign on the left. Concurrent bilateral hand tingling, possibly related to carpal tunnel syndrome. -Start Meloxicam 15mg  daily for 2 weeks then as needed for anti-inflammatory effect. -Start Methocarbamol as needed at night for muscle relaxation. -Initiate neck and wrist exercises at home. -Consider over-the-counter carpal tunnel braces for night use if symptoms persist after 2 weeks. -If symptoms persist or worsen after 2-4 weeks, consider cervical spine x-rays.      Relevant Medications   meloxicam (MOBIC) 15 MG tablet   methocarbamol (ROBAXIN) 500 MG tablet   Other Visit Diagnoses       Screening for cardiovascular condition       Relevant Orders   Lipid panel     Abnormal glucose       Relevant Orders   Comprehensive metabolic panel   Hemoglobin A1c        Orders & Medications Medications:  Meds ordered this encounter  Medications   meloxicam (MOBIC) 15 MG tablet    Sig: Take 1 tablet (15 mg total) by mouth daily. X 2 weeks then daily as needed (take with food)    Dispense:  30 tablet    Refill:  0   methocarbamol (ROBAXIN) 500 MG tablet    Sig: Take 1 tablet (500 mg total) by mouth at bedtime as needed for muscle spasms.    Dispense:  30 tablet    Refill:  0   Orders Placed This Encounter  Procedures   CBC   Comprehensive metabolic panel   Hemoglobin A1c   Lipid panel   Ambulatory referral to Obstetrics / Gynecology     Return in about 1 year (around 07/10/2024) for CPE.    Jerrol Banana, MD, [facility]   Primary Care Sports Medicine Primary Care and Sports Medicine at [facility]

## 2023-07-11 NOTE — Assessment & Plan Note (Signed)
Cervical Radiculopathy -left Recent onset (1-2-week) of neck pain with radiation to the left upper extremity. Positive Spurling's sign on the left. Concurrent bilateral hand tingling, possibly related to carpal tunnel syndrome. -Start Meloxicam 15mg  daily for 2 weeks then as needed for anti-inflammatory effect. -Start Methocarbamol as needed at night for muscle relaxation. -Initiate neck and wrist exercises at home. -Consider over-the-counter carpal tunnel braces for night use if symptoms persist after 2 weeks. -If symptoms persist or worsen after 2-4 weeks, consider cervical spine x-rays.

## 2023-07-11 NOTE — Assessment & Plan Note (Signed)
Amenorrhea Resolved. Menstrual cycle has returned to regularity without intervention. -No further action required at this time.

## 2023-07-11 NOTE — Patient Instructions (Addendum)
-   Obtain fasting labs with orders provided (can have water or black coffee but otherwise no food or drink x 8 hours before labs) - Review information provided - Attend eye doctor annually, dentist every 6 months, work towards or maintain 30 minutes of moderate intensity physical activity at least 5 days per week, and consume a balanced diet - Return in 1 year for physical - Contact us for any questions between now and then   Patient Next Steps  - Cervical Radiculopathy:    - Start taking Meloxicam 15mg  daily for 2 weeks to reduce inflammation.   - Take Methocarbamol at night for muscle relaxation.   - Begin neck and wrist exercises at home.   - If symptoms persist after 2 weeks, consider using over-the-counter carpal tunnel braces at night.   - If there is no improvement after 2-4 weeks, cervical spine x-rays may be necessary.  - Weight Management:    - Continue with yoga and other physical activities.   - Implement sustainable dietary changes for long-term weight management.  - Amenorrhea: No further action is required at this time as your menstrual cycle has returned to normal.  - General Health Maintenance:    - Schedule a cervical cancer screening (Pap smear) with gynecology. Referral has been placed.   - Complete routine labs as ordered.  Please follow these steps and contact us with any questions or concerns.

## 2023-07-11 NOTE — Assessment & Plan Note (Signed)
See additional assessment(s) for plan details.

## 2023-07-11 NOTE — Assessment & Plan Note (Signed)
Annual examination completed, risk stratification labs ordered, anticipatory guidance provided.  We will follow labs once resulted.  General Health Maintenance -Schedule cervical cancer screening (Pap smear) with gynecology. -Order routine labs.

## 2023-07-12 LAB — COMPREHENSIVE METABOLIC PANEL
ALT: 24 [IU]/L (ref 0–32)
AST: 23 [IU]/L (ref 0–40)
Albumin: 4.4 g/dL (ref 3.9–4.9)
Alkaline Phosphatase: 93 [IU]/L (ref 44–121)
BUN/Creatinine Ratio: 12 (ref 9–23)
BUN: 8 mg/dL (ref 6–24)
Bilirubin Total: 0.2 mg/dL (ref 0.0–1.2)
CO2: 22 mmol/L (ref 20–29)
Calcium: 9.1 mg/dL (ref 8.7–10.2)
Chloride: 105 mmol/L (ref 96–106)
Creatinine, Ser: 0.69 mg/dL (ref 0.57–1.00)
Globulin, Total: 2.5 g/dL (ref 1.5–4.5)
Glucose: 84 mg/dL (ref 70–99)
Potassium: 4.3 mmol/L (ref 3.5–5.2)
Sodium: 142 mmol/L (ref 134–144)
Total Protein: 6.9 g/dL (ref 6.0–8.5)
eGFR: 112 mL/min/{1.73_m2} (ref >59)

## 2023-07-12 LAB — LIPID PANEL
Chol/HDL Ratio: 2.4 {ratio} (ref 0.0–4.4)
Cholesterol, Total: 142 mg/dL (ref 100–199)
HDL: 58 mg/dL (ref >39)
LDL Chol Calc (NIH): 74 mg/dL (ref 0–99)
Triglycerides: 41 mg/dL (ref 0–149)
VLDL Cholesterol Cal: 10 mg/dL (ref 5–40)

## 2023-07-12 LAB — CBC
Hematocrit: 37.5 % (ref 34.0–46.6)
Hemoglobin: 12 g/dL (ref 11.1–15.9)
MCH: 28.8 pg (ref 26.6–33.0)
MCHC: 32 g/dL (ref 31.5–35.7)
MCV: 90 fL (ref 79–97)
Platelets: 358 10*3/uL (ref 150–450)
RBC: 4.16 x10E6/uL (ref 3.77–5.28)
RDW: 12.7 % (ref 11.7–15.4)
WBC: 6.8 10*3/uL (ref 3.4–10.8)

## 2023-07-12 LAB — HEMOGLOBIN A1C
Est. average glucose Bld gHb Est-mCnc: 114 mg/dL
Hgb A1c MFr Bld: 5.6 % (ref 4.8–5.6)

## 2023-07-14 ENCOUNTER — Encounter: Payer: Self-pay | Admitting: Family Medicine

## 2023-07-18 DIAGNOSIS — B349 Viral infection, unspecified: Secondary | ICD-10-CM | POA: Diagnosis not present

## 2023-08-08 ENCOUNTER — Other Ambulatory Visit: Payer: Self-pay | Admitting: Family Medicine

## 2023-08-08 DIAGNOSIS — M5412 Radiculopathy, cervical region: Secondary | ICD-10-CM

## 2023-08-09 ENCOUNTER — Ambulatory Visit: Payer: Federal, State, Local not specified - PPO | Admitting: Certified Nurse Midwife

## 2023-08-09 ENCOUNTER — Other Ambulatory Visit (HOSPITAL_COMMUNITY)
Admission: RE | Admit: 2023-08-09 | Discharge: 2023-08-09 | Disposition: A | Discharge status: Home / Self Care | Source: Ambulatory Visit | Attending: Certified Nurse Midwife | Admitting: Certified Nurse Midwife

## 2023-08-09 ENCOUNTER — Encounter: Payer: Self-pay | Admitting: Certified Nurse Midwife

## 2023-08-09 VITALS — BP 130/75 | HR 80 | Ht 63.0 in | Wt 180.0 lb

## 2023-08-09 DIAGNOSIS — Z1231 Encounter for screening mammogram for malignant neoplasm of breast: Secondary | ICD-10-CM

## 2023-08-09 DIAGNOSIS — Z1151 Encounter for screening for human papillomavirus (HPV): Secondary | ICD-10-CM | POA: Insufficient documentation

## 2023-08-09 DIAGNOSIS — Z01419 Encounter for gynecological examination (general) (routine) without abnormal findings: Secondary | ICD-10-CM

## 2023-08-09 DIAGNOSIS — Z124 Encounter for screening for malignant neoplasm of cervix: Secondary | ICD-10-CM

## 2023-08-09 NOTE — Telephone Encounter (Signed)
 Please review.  KP

## 2023-08-09 NOTE — Progress Notes (Signed)
 ANNUAL EXAM Patient name: Carly White MRN [id number]  Date of birth: Nov 03, 1980 Chief Complaint:   Establish Care and Annual Exam  History of Present Illness:   Carly White is a 43 y.o. [phone] African-American female being seen today for a routine annual exam.  Current complaints: none, feeling well. Due for pap & mammogram is due in May. 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  Patient's last menstrual period was 08/06/2023 (exact date).   Upstream - 08/08/23 1342       Contraception Wrap Up   Current Method Vasectomy    End Method Vasectomy    Contraception Counseling Provided No    How was the end contraceptive method provided? N/A            The pregnancy intention screening data noted above was reviewed. Potential methods of contraception were discussed. The patient elected to proceed with Vasectomy.      Component Value Date/Time   DIAGPAP  04/30/2019 0000    - Negative for intraepithelial lesion or malignancy (NILM)   HPVHIGH Negative 04/30/2019 0000   ADEQPAP  04/30/2019 0000    Satisfactory for evaluation; transformation zone component PRESENT.    Last mammogram: 5/24. Results were: normal. Family h/o breast cancer: yes MGM, mother, MGGM Last colonoscopy: n/a. Results were: N/A. Family h/o colorectal cancer: yes MGF     08/09/2023    9:52 AM 07/11/2023    9:04 AM 07/08/2022   10:17 AM 03/23/2021   11:17 AM  Depression screen PHQ 2/9  Decreased Interest 0 0 0 0  Down, Depressed, Hopeless 1 0 0 0  PHQ - 2 Score 1 0 0 0  Altered sleeping  0 0 0  Tired, decreased energy  1 0 0  Change in appetite  0 0 0  Feeling bad or failure about yourself   0 0 0  Trouble concentrating  1 0 0  Moving slowly or fidgety/restless  1 0 0  Suicidal thoughts  0 0 0  PHQ-9 Score  3 0 0  Difficult doing work/chores  Not difficult at all Not difficult at all Not difficult at all        07/11/2023    9:04 AM 07/08/2022    10:17 AM 03/23/2021   11:17 AM  GAD 7 : Generalized Anxiety Score  Nervous, Anxious, on Edge 2 0 0  Control/stop worrying 1 0 0  Worry too much - different things 1 0 0  Trouble relaxing 0 0 0  Restless 0 0 0  Easily annoyed or irritable 0 0 0  Afraid - awful might happen 0 0 0  Total GAD 7 Score 4 0 0  Anxiety Difficulty Not difficult at all Not difficult at all Not difficult at all     Review of Systems:   Pertinent items are noted in HPI Denies any headaches, blurred vision, fatigue, shortness of breath, chest pain, abdominal pain, abnormal vaginal discharge/itching/odor/irritation, problems with periods, bowel movements, urination, or intercourse unless otherwise stated above. Pertinent History Reviewed:  Reviewed past medical,surgical, social and family history.  Reviewed problem list, medications and allergies. Physical Assessment:   Vitals:   08/09/23 0903  BP: 130/75  Pulse: 80  Weight: 180 lb (81.6 kg)  Height: 5\' 3"  (1.6 m)  Body mass index is 31.89 kg/m.       Physical Exam Vitals reviewed.  Constitutional:      Appearance: Normal appearance.  HENT:  Head: Normocephalic.  Neck:     Thyroid: No thyroid mass or thyromegaly.  Cardiovascular:     Rate and Rhythm: Normal rate and regular rhythm.     Heart sounds: Normal heart sounds.  Pulmonary:     Effort: Pulmonary effort is normal.     Breath sounds: Normal breath sounds.  Chest:  Breasts:    Tanner Score is 5.     Right: Normal.     Left: Normal.  Abdominal:     General: Abdomen is flat.     Palpations: Abdomen is soft.     Tenderness: There is no abdominal tenderness.  Genitourinary:    General: Normal vulva.     Vagina: Normal.     Cervix: Normal.     Uterus: Not enlarged and not tender.      Adnexa:        Right: No mass or tenderness.         Left: No mass or tenderness.       Rectum: External hemorrhoid present.  Musculoskeletal:     Cervical back: Neck supple. No tenderness.   Skin:    General: Skin is warm and dry.  Neurological:     General: No focal deficit present.     Mental Status: She is alert and oriented to person, place, and time.  Psychiatric:        Mood and Affect: Mood normal.        Behavior: Behavior normal.      No results found for this or any previous visit (from the past 24 hours).  Assessment & Plan:  1. Encounter for screening for human papillomavirus (HPV) - Cytology - PAP ([place])  2. Well woman exam with routine gynecological exam - Cytology - PAP ([place])  3. Cervical cancer screening (Primary) - Cytology - PAP ()  4. Breast cancer screening by mammogram - MM 3D DIAGNOSTIC MAMMOGRAM BILATERAL BREAST; Future  Mammogram:  schedule in May, order placed , or sooner if problems Colonoscopy: @ 43yo, or sooner if problems  Orders Placed This Encounter  Procedures   MM 3D DIAGNOSTIC MAMMOGRAM BILATERAL BREAST    Meds: No orders of the defined types were placed in this encounter.   Follow-up: Return in 1 year (on 08/08/2024) for Annual exam.  Dominica Severin, CNM 08/09/2023 1:27 PM

## 2023-08-09 NOTE — Telephone Encounter (Signed)
 Requested medication (s) are due for refill today: yes  Requested medication (s) are on the active medication list: yes  Last refill:  07/11/23  Future visit scheduled: yes  Notes to clinic:  short supply lasted refilled, 07/11/23. Should patient continue to take. Routing for review.     Requested Prescriptions  Pending Prescriptions Disp Refills   meloxicam (MOBIC) 15 MG tablet [Pharmacy Med Name: MELOXICAM 15MG  TABLETS] 30 tablet 0    Sig: TAKE 1 TABLET(15 MG) BY MOUTH DAILY WITH FOOD FOR 2 WEEKS AS NEEDED     Analgesics:  COX2 Inhibitors Failed - 08/09/2023  9:27 AM      Failed - Manual Review: Labs are only required if the patient has taken medication for more than 8 weeks.      Passed - HGB in normal range and within 360 days    Hemoglobin  Date Value Ref Range Status  07/11/2023 12.0 11.1 - 15.9 g/dL Final         Passed - Cr in normal range and within 360 days    Creatinine, Ser  Date Value Ref Range Status  07/11/2023 0.69 0.57 - 1.00 mg/dL Final         Passed - HCT in normal range and within 360 days    Hematocrit  Date Value Ref Range Status  07/11/2023 37.5 34.0 - 46.6 % Final         Passed - AST in normal range and within 360 days    AST  Date Value Ref Range Status  07/11/2023 23 0 - 40 IU/L Final         Passed - ALT in normal range and within 360 days    ALT  Date Value Ref Range Status  07/11/2023 24 0 - 32 IU/L Final         Passed - eGFR is 30 or above and within 360 days    GFR calc Af Amer  Date Value Ref Range Status  01/28/2019 >60 >60 mL/min Final   GFR, Estimated  Date Value Ref Range Status  12/15/2020 >60 >60 mL/min Final    Comment:    (NOTE) Calculated using the CKD-EPI Creatinine Equation (2021)    eGFR  Date Value Ref Range Status  07/11/2023 112 >59 mL/min/1.73 Final         Passed - Patient is not pregnant      Passed - Valid encounter within last 12 months    Recent Outpatient Visits           4 weeks ago  Healthcare maintenance   [facility] Primary Care & [facility] Medicine at [facility] Ashley Royalty, Ocie Bob, MD   1 year ago Annual physical exam   [facility] at [facility], Ocie Bob, MD   2 years ago Threatened miscarriage   [place] Primary Care & Sports Medicine at [facility], Ocie Bob, MD       Future Appointments             In 11 months Ashley Royalty, Ocie Bob, MD [facility] Primary Care & Sports Medicine at [facility], [facility]

## 2023-08-09 NOTE — Patient Instructions (Signed)

## 2023-08-11 LAB — CYTOLOGY - PAP
Comment: NEGATIVE
Diagnosis: NEGATIVE
Diagnosis: REACTIVE
High risk HPV: NEGATIVE

## 2023-08-13 ENCOUNTER — Encounter: Payer: Self-pay | Admitting: Certified Nurse Midwife

## 2023-08-28 ENCOUNTER — Other Ambulatory Visit: Payer: Self-pay | Admitting: Certified Nurse Midwife

## 2023-08-28 DIAGNOSIS — Z1231 Encounter for screening mammogram for malignant neoplasm of breast: Secondary | ICD-10-CM

## 2023-11-07 ENCOUNTER — Ambulatory Visit
Admission: RE | Admit: 2023-11-07 | Discharge: 2023-11-07 | Disposition: A | Discharge status: Home / Self Care | Source: Ambulatory Visit | Attending: Certified Nurse Midwife | Admitting: Certified Nurse Midwife

## 2023-11-07 DIAGNOSIS — Z1231 Encounter for screening mammogram for malignant neoplasm of breast: Secondary | ICD-10-CM | POA: Diagnosis not present

## 2024-07-11 ENCOUNTER — Encounter: Payer: Self-pay | Admitting: Family Medicine

## 2024-07-11 ENCOUNTER — Ambulatory Visit
Admission: RE | Admit: 2024-07-11 | Discharge: 2024-07-11 | Disposition: A | Discharge status: Home / Self Care | Attending: Family Medicine | Admitting: Family Medicine

## 2024-07-11 ENCOUNTER — Ambulatory Visit (INDEPENDENT_AMBULATORY_CARE_PROVIDER_SITE_OTHER): Payer: Self-pay | Admitting: Family Medicine

## 2024-07-11 ENCOUNTER — Ambulatory Visit
Admission: RE | Admit: 2024-07-11 | Discharge: 2024-07-11 | Disposition: A | Discharge status: Home / Self Care | Source: Ambulatory Visit | Attending: Family Medicine | Admitting: Family Medicine

## 2024-07-11 VITALS — BP 108/76 | HR 88 | Ht 63.0 in | Wt 186.0 lb

## 2024-07-11 DIAGNOSIS — Z1231 Encounter for screening mammogram for malignant neoplasm of breast: Secondary | ICD-10-CM | POA: Diagnosis not present

## 2024-07-11 DIAGNOSIS — L83 Acanthosis nigricans: Secondary | ICD-10-CM | POA: Insufficient documentation

## 2024-07-11 DIAGNOSIS — N911 Secondary amenorrhea: Secondary | ICD-10-CM | POA: Diagnosis not present

## 2024-07-11 DIAGNOSIS — R200 Anesthesia of skin: Secondary | ICD-10-CM | POA: Diagnosis not present

## 2024-07-11 DIAGNOSIS — M5459 Other low back pain: Secondary | ICD-10-CM

## 2024-07-11 DIAGNOSIS — Z0000 Encounter for general adult medical examination without abnormal findings: Secondary | ICD-10-CM | POA: Diagnosis not present

## 2024-07-11 DIAGNOSIS — M5412 Radiculopathy, cervical region: Secondary | ICD-10-CM | POA: Diagnosis not present

## 2024-07-11 MED ORDER — DICLOFENAC SODIUM 50 MG PO TBEC: 50.0000 mg | DELAYED_RELEASE_TABLET | Freq: Two times a day (BID) | ORAL | 0 refills | Status: AC

## 2024-07-11 MED ORDER — METHOCARBAMOL 500 MG PO TABS: 500.0000 mg | ORAL_TABLET | Freq: Every evening | ORAL | 1 refills | Status: AC | PRN

## 2024-07-11 NOTE — Patient Instructions (Signed)
-   Obtain fasting labs with orders provided (can have water or black coffee but otherwise no food or drink x 8 hours before labs) - Review information provided - Attend eye doctor annually, dentist every 6 months, work towards or maintain 30 minutes of moderate intensity physical activity at least 5 days per week, and consume a balanced diet - Return in 1 year for physical - Contact us  for any questions between now and then   VISIT SUMMARY: During your visit, we discussed your chronic low back pain, neck and upper extremity symptoms, a persistent neck lesion, and perimenopausal symptoms. We have developed a plan to address each of these issues and ordered necessary tests and referrals.  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 This is consistent with lumbar spondylosis. -We ordered lumbar spine x-rays to check for structural changes, including arthritis or disc space narrowing. -You are prescribed diclofenac  12-hour formulation, to be taken twice daily with food for one week, then as needed. -You are prescribed methocarbamol  at bedtime as needed, but be cautious as it may cause drowsiness. -Low back-specific core stabilization exercise instructions have been sent to you via MyChart. -We expect gradual improvement with functional gains and reduced medication need as primary outcomes. -Follow up if symptoms do not significantly improve after six weeks for consideration of lumbar MRI and referral to interventional spine group. -Message us  with any questions or if symptoms worsen.  ACANTHOSIS NIGRICANS, RIGHT POSTERIOR LATERAL NECK: You have a persistent, tender, hyperpigmented patch on your neck, which may be related to insulin resistance, thyroid  dysfunction, or other metabolic issues. -We ordered lab tests to evaluate your thyroid  and glucose levels. -A referral to dermatology has been made for  further evaluation and management. -A clinical photograph was taken for the dermatology consultation and documentation. -Check MyChart for updates and additional recommendations before your dermatology visit.  BREAST CANCER SCREENING: You are due for routine breast cancer screening. -We have initiated scheduling for your screening mammogram. You will be contacted to arrange the appointment.    Contains text generated by [employer].

## 2024-07-11 NOTE — Assessment & Plan Note (Signed)
 Carly White

## 2024-07-11 NOTE — Assessment & Plan Note (Signed)
" °  Orders:   DG Lumbar Spine Complete; Future   diclofenac  (VOLTAREN ) 50 MG EC tablet; Take 1 tablet (50 mg total) by mouth 2 (two) times daily. X 1 week then BID PRN. Take with food.   methocarbamol  (ROBAXIN ) 500 MG tablet; Take 1 tablet (500 mg total) by mouth at bedtime as needed for muscle spasms.  "

## 2024-07-11 NOTE — Assessment & Plan Note (Signed)
Orders:    Ambulatory referral to Dermatology

## 2024-07-11 NOTE — Assessment & Plan Note (Signed)
 SABRA

## 2024-07-11 NOTE — Progress Notes (Signed)
 "    Annual Physical Exam Visit  Patient Information:  Patient ID: Carly White, female DOB: 1980/07/19 Age: 44 y.o. MRN: [id number]   Subjective:   CC: Annual Physical Exam  HPI:  Carly White is here for their annual physical.  I reviewed the past medical history, family history, social history, surgical history, and allergies today and changes were made as necessary.  Please see the problem list section below for additional details.  Past Medical History: Past Medical History:  Diagnosis Date   Annual physical exam 07/08/2022   Ectopic pregnancy 03/13/2014   Family history of breast cancer    Family history of cervical cancer    Family history of colon cancer    Past Surgical History: Past Surgical History:  Procedure Laterality Date   DIAGNOSTIC LAPAROSCOPY WITH REMOVAL OF ECTOPIC PREGNANCY Right 01/28/2019   Procedure: DIAGNOSTIC LAPAROSCOPY  for removal of ectopic pregnancy;  Surgeon: Rosalva Sawyer, MD;  Location: [facility] OR;  Service: Gynecology;  Laterality: Right;   Family History: Family History  Problem Relation Age of Onset   Breast cancer Mother 38   Cancer Mother    Colon cancer Maternal Uncle 20   Breast cancer Maternal Grandmother    Cervical cancer Maternal Grandmother        dx 39s   Colon cancer Maternal Grandfather        dx 69s   Diabetes Paternal Grandfather    Breast cancer Maternal Great-grandmother        dx 45s   Allergies: Allergies[1] Health Maintenance: Health Maintenance  Topic Date Due   Hepatitis B Vaccines 19-59 Average Risk (1 of 3 - 19+ 3-dose series) 07/11/2025 (Originally 05/18/2001)   Mammogram  11/06/2025   DTaP/Tdap/Td (2 - Td or Tdap) 01/11/2027   Cervical Cancer Screening (HPV/Pap Cotest)  08/08/2028   Influenza Vaccine  Completed   HPV VACCINES (No Doses Required) Completed   Hepatitis C Screening  Completed   HIV Screening  Completed   Pneumococcal Vaccine  Aged Out   Meningococcal B Vaccine  Aged Out   COVID-19  Vaccine  Discontinued    HM Colonoscopy   This patient has no relevant Health Maintenance data.    Medications: Medications Ordered Prior to Encounter[2]  Discussed the use of AI scribe software for clinical note transcription with the patient, who gave verbal consent to proceed.   Objective:   Vitals:   07/11/24 0854  BP: 108/76  Pulse: 88  SpO2: 99%   Vitals:   07/11/24 0854  Weight: 186 lb (84.4 kg)  Height: 5' 3 (1.6 m)   Body mass index is 32.95 kg/m.  General: Well Developed, well nourished, and in no acute distress.  Neuro: Alert and oriented x3, extra-ocular muscles intact, sensation grossly intact. Cranial nerves II through XII are grossly intact, motor, sensory, and coordinative functions are intact. HEENT: Normocephalic, atraumatic, neck supple, no masses, no lymphadenopathy, thyroid  nonenlarged. Oropharynx, nasopharynx, external ear canals are unremarkable. Skin: Warm and dry, acanthosis like rash posterolateral right neck noted (see image below).  Cardiac: Regular rate and rhythm, no murmurs rubs or gallops. No peripheral edema. Pulses symmetric. Respiratory: Clear to auscultation bilaterally. Speaking in full sentences.  Abdominal: Soft, nontender, nondistended, positive bowel sounds, no masses, no organomegaly. Musculoskeletal: Stable, and with full range of motion.    Media Information  Document Information  Photographic Image: Photos  Right posterolateral neck  07/11/2024 09:18  Attached To:  Office Visit on 07/11/24 with Carly Carly PARAS,  MD  Source Information  Carly Carly PARAS, MD  [facility]    Impression and Recommendations:   The patient was counselled, risk factors were discussed, and anticipatory guidance given.  Discussed the use of AI scribe software for clinical note transcription with the patient, who gave verbal consent to proceed.  History of Present Illness   Carly White is a 44 year old female with chronic  low back pain who presents for evaluation of musculoskeletal symptoms.  Low Back Pain and Stiffness: - Chronic low back pain with variable intensity - Persistent stiffness, especially upon rising from a seated position - Regular stretching and movement exercises with her daughter provide partial relief - Pain continues to impact daily activities - Previously trialed meloxicam  without benefit; not currently using it - Ambulates around her office every hour to mitigate symptoms  Neck and Upper Extremity Symptoms: - Intermittent left arm and neck pain - Associated numbness and tingling - Prior hand numbness in both hands has resolved since starting a workout regimen focused on movement and circulation  Cutaneous Neck Lesion: - Persistent, tender patch on the right posterior lateral neck, present since the summer - Lesion described as feeling like a burn - Application of moisturizers and aloe without improvement - No family history of similar lesions - No prior dermatology evaluation  Menstrual and Perimenopausal Symptoms: - Menstrual cycles regular every 28 to 30 days with variable flow - Perimenopausal symptoms include hot flashes - Hot flashes managed with environmental measures - No use of medications or supplements for perimenopausal symptoms - No respiratory symptoms       Assessment and Plan    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 Pain impairs daily function. Improvement expected with combined pharmacologic and exercise therapy; primary goals are functional improvement and reduced medication use. - Ordered lumbar spine x-rays to assess for structural changes, including arthritis or disc space narrowing. - Prescribed diclofenac  12-hour formulation, to be taken twice daily with food for one week, then as needed. - Prescribed methocarbamol  at bedtime as  needed, with caution regarding sedation. - Sent low back-specific core stabilization exercise instructions via MyChart. - Provided anticipatory guidance regarding expected gradual improvement, with functional gains and reduced medication need as primary outcomes. - Instructed to follow up if symptoms do not significantly improve after six weeks for consideration of lumbar MRI and referral to interventional spine group. - Advised to message with questions or if symptoms worsen.  Acanthosis nigricans, right posterior lateral neck Persistent, tender, hyperpigmented patch on the right posterior lateral neck, consistent with acanthosis nigricans. Possible association with insulin resistance, thyroid  dysfunction, or other endocrine/metabolic abnormalities. Dermatology referral indicated for definitive diagnosis and management. - Ordered laboratory studies for metabolic and endocrine evaluation, including thyroid  and glucose levels. - Placed dermatology referral for further evaluation and management. - Obtained clinical photograph for dermatology consultation and documentation. - Provided anticipatory guidance that abnormal laboratory findings may indicate an underlying condition amenable to treatment. - Advised to check MyChart for updates and additional recommendations prior to dermatology visit.  Breast cancer screening Due for routine breast cancer screening. - Initiated scheduling for screening mammogram and informed she will be contacted to arrange the appointment.         Assessment & Plan Healthcare maintenance  Orders:   CBC   Comprehensive metabolic panel with GFR   Hemoglobin A1c   Lipid panel  TSH  Breast cancer screening by mammogram     Mechanical low back pain  Orders:   DG Lumbar Spine Complete; Future   diclofenac  (VOLTAREN ) 50 MG EC tablet; Take 1 tablet (50 mg total) by mouth 2 (two) times daily. X 1 week then BID PRN. Take with food.   methocarbamol  (ROBAXIN )  500 MG tablet; Take 1 tablet (500 mg total) by mouth at bedtime as needed for muscle spasms.  Amenorrhea, secondary     Left cervical radiculopathy     Bilateral hand numbness     Acanthosis  Orders:   Ambulatory referral to Dermatology     No follow-ups on file.    Carly JINNY Ku, MD, CAQSM   [facility]      [1]  Allergies Allergen Reactions   Covid-19 (Mrna) Vaccine Other (See Comments)  [2]  No current outpatient medications on file prior to visit.   No current facility-administered medications on file prior to visit.   "

## 2024-07-11 NOTE — Assessment & Plan Note (Signed)
" °  Orders:   CBC   Comprehensive metabolic panel with GFR   Hemoglobin A1c   Lipid panel   TSH  "

## 2024-07-12 ENCOUNTER — Ambulatory Visit: Payer: Self-pay | Admitting: Family Medicine

## 2024-07-12 LAB — COMPREHENSIVE METABOLIC PANEL WITH GFR
ALT: 24 [IU]/L (ref 0–32)
AST: 20 [IU]/L (ref 0–40)
Albumin: 4.6 g/dL (ref 3.9–4.9)
Alkaline Phosphatase: 89 [IU]/L (ref 41–116)
BUN/Creatinine Ratio: 17 (ref 9–23)
BUN: 13 mg/dL (ref 6–24)
Bilirubin Total: 0.3 mg/dL (ref 0.0–1.2)
CO2: 22 mmol/L (ref 20–29)
Calcium: 9.6 mg/dL (ref 8.7–10.2)
Chloride: 107 mmol/L — ABNORMAL HIGH (ref 96–106)
Creatinine, Ser: 0.77 mg/dL (ref 0.57–1.00)
Globulin, Total: 2.8 g/dL (ref 1.5–4.5)
Glucose: 92 mg/dL (ref 70–99)
Potassium: 4.8 mmol/L (ref 3.5–5.2)
Sodium: 144 mmol/L (ref 134–144)
Total Protein: 7.4 g/dL (ref 6.0–8.5)
eGFR: 99 mL/min/{1.73_m2}

## 2024-07-12 LAB — LIPID PANEL
Chol/HDL Ratio: 2.7 ratio (ref 0.0–4.4)
Cholesterol, Total: 165 mg/dL (ref 100–199)
HDL: 61 mg/dL
LDL Chol Calc (NIH): 94 mg/dL (ref 0–99)
Triglycerides: 50 mg/dL (ref 0–149)
VLDL Cholesterol Cal: 10 mg/dL (ref 5–40)

## 2024-07-12 LAB — CBC
Hematocrit: 38.7 % (ref 34.0–46.6)
Hemoglobin: 13.1 g/dL (ref 11.1–15.9)
MCH: 29.8 pg (ref 26.6–33.0)
MCHC: 33.9 g/dL (ref 31.5–35.7)
MCV: 88 fL (ref 79–97)
Platelets: 373 10*3/uL (ref 150–450)
RBC: 4.39 x10E6/uL (ref 3.77–5.28)
RDW: 12.2 % (ref 11.7–15.4)
WBC: 7.5 10*3/uL (ref 3.4–10.8)

## 2024-07-12 LAB — HEMOGLOBIN A1C
Est. average glucose Bld gHb Est-mCnc: 114 mg/dL
Hgb A1c MFr Bld: 5.6 % (ref 4.8–5.6)

## 2024-07-12 LAB — TSH: TSH: 4.11 u[IU]/mL (ref 0.450–4.500)

## 2024-07-25 ENCOUNTER — Ambulatory Visit

## 2025-07-15 ENCOUNTER — Encounter: Admitting: Physician Assistant
